# Patient Record
Sex: Male | Born: 1950 | Race: White | Hispanic: No | Marital: Single | Smoking: Current every day smoker
Health system: Southern US, Community
[De-identification: ages and names within clinical notes are randomized; demographics above are authoritative.]

## PROBLEM LIST (undated history)

## (undated) DIAGNOSIS — I10 Essential (primary) hypertension: Secondary | ICD-10-CM

## (undated) DIAGNOSIS — K219 Gastro-esophageal reflux disease without esophagitis: Secondary | ICD-10-CM

## (undated) DIAGNOSIS — E785 Hyperlipidemia, unspecified: Secondary | ICD-10-CM

## (undated) DIAGNOSIS — K279 Peptic ulcer, site unspecified, unspecified as acute or chronic, without hemorrhage or perforation: Secondary | ICD-10-CM

## (undated) DIAGNOSIS — E119 Type 2 diabetes mellitus without complications: Secondary | ICD-10-CM

## (undated) HISTORY — PX: CARDIAC CATHETERIZATION: SHX172

## (undated) HISTORY — PX: TONSILLECTOMY: SUR1361

---

## 2002-11-20 ENCOUNTER — Emergency Department (HOSPITAL_COMMUNITY): Admission: EM | Admit: 2002-11-20 | Discharge: 2002-11-20 | Payer: Self-pay | Admitting: Emergency Medicine

## 2004-12-25 ENCOUNTER — Emergency Department (HOSPITAL_COMMUNITY): Admission: EM | Admit: 2004-12-25 | Discharge: 2004-12-26 | Payer: Self-pay | Admitting: Emergency Medicine

## 2006-04-18 ENCOUNTER — Inpatient Hospital Stay (HOSPITAL_BASED_OUTPATIENT_CLINIC_OR_DEPARTMENT_OTHER): Admission: RE | Admit: 2006-04-18 | Discharge: 2006-04-18 | Payer: Self-pay | Admitting: *Deleted

## 2017-08-21 ENCOUNTER — Encounter (HOSPITAL_COMMUNITY): Payer: Self-pay | Admitting: Radiology

## 2017-08-21 ENCOUNTER — Emergency Department (HOSPITAL_COMMUNITY): Admission: EM | Admit: 2017-08-21 | Payer: Self-pay | Source: Home / Self Care

## 2017-08-21 ENCOUNTER — Other Ambulatory Visit: Payer: Self-pay

## 2017-08-21 ENCOUNTER — Emergency Department (HOSPITAL_COMMUNITY): Payer: Self-pay

## 2017-08-21 ENCOUNTER — Inpatient Hospital Stay (HOSPITAL_COMMUNITY)
Admission: EM | Admit: 2017-08-21 | Discharge: 2017-08-22 | DRG: 392 | Disposition: A | Discharge status: Home / Self Care | Payer: Self-pay | Attending: Family Medicine | Admitting: Family Medicine

## 2017-08-21 DIAGNOSIS — I10 Essential (primary) hypertension: Secondary | ICD-10-CM | POA: Diagnosis present

## 2017-08-21 DIAGNOSIS — K920 Hematemesis: Secondary | ICD-10-CM

## 2017-08-21 DIAGNOSIS — K219 Gastro-esophageal reflux disease without esophagitis: Principal | ICD-10-CM | POA: Diagnosis present

## 2017-08-21 DIAGNOSIS — K922 Gastrointestinal hemorrhage, unspecified: Secondary | ICD-10-CM | POA: Diagnosis present

## 2017-08-21 DIAGNOSIS — R197 Diarrhea, unspecified: Secondary | ICD-10-CM | POA: Diagnosis present

## 2017-08-21 DIAGNOSIS — Z8249 Family history of ischemic heart disease and other diseases of the circulatory system: Secondary | ICD-10-CM

## 2017-08-21 DIAGNOSIS — K2210 Ulcer of esophagus without bleeding: Secondary | ICD-10-CM | POA: Diagnosis present

## 2017-08-21 DIAGNOSIS — Z7984 Long term (current) use of oral hypoglycemic drugs: Secondary | ICD-10-CM

## 2017-08-21 DIAGNOSIS — F17210 Nicotine dependence, cigarettes, uncomplicated: Secondary | ICD-10-CM | POA: Diagnosis present

## 2017-08-21 DIAGNOSIS — E119 Type 2 diabetes mellitus without complications: Secondary | ICD-10-CM | POA: Diagnosis present

## 2017-08-21 DIAGNOSIS — E785 Hyperlipidemia, unspecified: Secondary | ICD-10-CM | POA: Diagnosis present

## 2017-08-21 DIAGNOSIS — R112 Nausea with vomiting, unspecified: Secondary | ICD-10-CM | POA: Diagnosis present

## 2017-08-21 HISTORY — DX: Type 2 diabetes mellitus without complications: E11.9

## 2017-08-21 HISTORY — DX: Gastro-esophageal reflux disease without esophagitis: K21.9

## 2017-08-21 HISTORY — DX: Essential (primary) hypertension: I10

## 2017-08-21 HISTORY — DX: Hyperlipidemia, unspecified: E78.5

## 2017-08-21 HISTORY — DX: Peptic ulcer, site unspecified, unspecified as acute or chronic, without hemorrhage or perforation: K27.9

## 2017-08-21 LAB — CBC WITH DIFFERENTIAL/PLATELET
Basophils Absolute: 0 10*3/uL (ref 0.0–0.1)
Basophils Relative: 0 %
Eosinophils Absolute: 0 10*3/uL (ref 0.0–0.7)
Eosinophils Relative: 0 %
HEMATOCRIT: 41.9 % (ref 39.0–52.0)
Hemoglobin: 14.2 g/dL (ref 13.0–17.0)
Lymphocytes Relative: 4 %
Lymphs Abs: 0.5 10*3/uL — ABNORMAL LOW (ref 0.7–4.0)
MCH: 30.5 pg (ref 26.0–34.0)
MCHC: 33.9 g/dL (ref 30.0–36.0)
MCV: 89.9 fL (ref 78.0–100.0)
Monocytes Absolute: 0.8 10*3/uL (ref 0.1–1.0)
Monocytes Relative: 6 %
Neutro Abs: 12.2 10*3/uL — ABNORMAL HIGH (ref 1.7–7.7)
Neutrophils Relative %: 90 %
PLATELETS: 251 10*3/uL (ref 150–400)
RBC: 4.66 MIL/uL (ref 4.22–5.81)
RDW: 12.4 % (ref 11.5–15.5)
WBC: 13.5 10*3/uL — AB (ref 4.0–10.5)

## 2017-08-21 LAB — I-STAT CHEM 8, ED
BUN: 14 mg/dL (ref 6–20)
CHLORIDE: 101 mmol/L (ref 101–111)
Calcium, Ion: 1.52 mmol/L (ref 1.15–1.40)
Creatinine, Ser: 1.3 mg/dL — ABNORMAL HIGH (ref 0.61–1.24)
Glucose, Bld: 274 mg/dL — ABNORMAL HIGH (ref 65–99)
HEMATOCRIT: 44 % (ref 39.0–52.0)
Hemoglobin: 15 g/dL (ref 13.0–17.0)
POTASSIUM: 4.1 mmol/L (ref 3.5–5.1)
SODIUM: 141 mmol/L (ref 135–145)
TCO2: 27 mmol/L (ref 22–32)

## 2017-08-21 LAB — COMPREHENSIVE METABOLIC PANEL WITH GFR
ALT: 19 U/L (ref 17–63)
AST: 22 U/L (ref 15–41)
Albumin: 4.5 g/dL (ref 3.5–5.0)
Alkaline Phosphatase: 50 U/L (ref 38–126)
Anion gap: 10 (ref 5–15)
BUN: 15 mg/dL (ref 6–20)
CO2: 30 mmol/L (ref 22–32)
Calcium: 12.1 mg/dL — ABNORMAL HIGH (ref 8.9–10.3)
Chloride: 103 mmol/L (ref 101–111)
Creatinine, Ser: 1.49 mg/dL — ABNORMAL HIGH (ref 0.61–1.24)
GFR calc Af Amer: 55 mL/min — ABNORMAL LOW
GFR calc non Af Amer: 48 mL/min — ABNORMAL LOW
Glucose, Bld: 278 mg/dL — ABNORMAL HIGH (ref 65–99)
Potassium: 4.6 mmol/L (ref 3.5–5.1)
Sodium: 143 mmol/L (ref 135–145)
Total Bilirubin: 1 mg/dL (ref 0.3–1.2)
Total Protein: 7.4 g/dL (ref 6.5–8.1)

## 2017-08-21 LAB — COMPREHENSIVE METABOLIC PANEL

## 2017-08-21 LAB — I-STAT CG4 LACTIC ACID, ED
Lactic Acid, Venous: 2.67 mmol/L (ref 0.5–1.9)
Lactic Acid, Venous: 1.7 mmol/L (ref 0.5–1.9)

## 2017-08-21 LAB — TYPE AND SCREEN
ABO/RH(D): A NEG
Antibody Screen: NEGATIVE

## 2017-08-21 LAB — I-STAT TROPONIN, ED
TROPONIN I, POC: 0 ng/mL (ref 0.00–0.08)
Troponin i, poc: 0.03 ng/mL (ref 0.00–0.08)

## 2017-08-21 LAB — URINALYSIS, ROUTINE W REFLEX MICROSCOPIC
Bacteria, UA: NONE SEEN
Bilirubin Urine: NEGATIVE
Glucose, UA: >=500 mg/dL — AB
Ketones, ur: 20 mg/dL — AB
Leukocytes, UA: NEGATIVE
Nitrite: NEGATIVE
Protein, ur: NEGATIVE mg/dL
Specific Gravity, Urine: 1.026 (ref 1.005–1.030)
pH: 7 (ref 5.0–8.0)

## 2017-08-21 LAB — CBG MONITORING, ED: Glucose-Capillary: 285 mg/dL — ABNORMAL HIGH (ref 65–99)

## 2017-08-21 LAB — LIPASE, BLOOD: Lipase: 36 U/L (ref 11–51)

## 2017-08-21 LAB — CK: Total CK: 72 U/L (ref 49–397)

## 2017-08-21 MED ORDER — SODIUM CHLORIDE 0.9 % IV SOLN
8.0000 mg/h | INTRAVENOUS | Status: DC
  Administered 2017-08-22 (×2): 8 mg/h via INTRAVENOUS
  Filled 2017-08-21 (×4): qty 80

## 2017-08-21 MED ORDER — ONDANSETRON HCL 4 MG/2ML IJ SOLN
4.0000 mg | Freq: Once | INTRAMUSCULAR | Status: AC
  Administered 2017-08-21: 4 mg via INTRAVENOUS

## 2017-08-21 MED ORDER — IOPAMIDOL (ISOVUE-370) INJECTION 76%
100.0000 mL | Freq: Once | INTRAVENOUS | Status: AC | PRN
  Administered 2017-08-21: 100 mL via INTRAVENOUS

## 2017-08-21 MED ORDER — SODIUM CHLORIDE 0.9 % IV BOLUS
1000.0000 mL | Freq: Once | INTRAVENOUS | Status: AC
  Administered 2017-08-21: 1000 mL via INTRAVENOUS

## 2017-08-21 MED ORDER — SODIUM CHLORIDE 0.9 % IV SOLN
INTRAVENOUS | Status: AC
  Administered 2017-08-22: via INTRAVENOUS

## 2017-08-21 MED ORDER — ACETAMINOPHEN 650 MG RE SUPP: 650.0000 mg | Freq: Four times a day (QID) | RECTAL | Status: DC | PRN

## 2017-08-21 MED ORDER — IOPAMIDOL (ISOVUE-370) INJECTION 76%
INTRAVENOUS | Status: AC
  Filled 2017-08-21: qty 100

## 2017-08-21 MED ORDER — MORPHINE SULFATE (PF) 4 MG/ML IV SOLN: 4.0000 mg | Freq: Once | INTRAVENOUS | Status: DC

## 2017-08-21 MED ORDER — SODIUM CHLORIDE 0.9 % IV SOLN
80.0000 mg | Freq: Once | INTRAVENOUS | Status: AC
  Administered 2017-08-22: 80 mg via INTRAVENOUS
  Filled 2017-08-21: qty 80

## 2017-08-21 MED ORDER — NITROGLYCERIN IN D5W 200-5 MCG/ML-% IV SOLN: 0.0000 ug/min | Freq: Once | INTRAVENOUS | Status: DC

## 2017-08-21 MED ORDER — PROMETHAZINE HCL 25 MG/ML IJ SOLN
12.5000 mg | Freq: Once | INTRAMUSCULAR | Status: AC
  Administered 2017-08-21: 12.5 mg via INTRAVENOUS
  Filled 2017-08-21: qty 1

## 2017-08-21 MED ORDER — SODIUM CHLORIDE 0.9 % IV BOLUS: 1000.0000 mL | Freq: Once | INTRAVENOUS | Status: DC

## 2017-08-21 MED ORDER — INSULIN ASPART 100 UNIT/ML ~~LOC~~ SOLN
0.0000 [IU] | SUBCUTANEOUS | Status: DC
  Administered 2017-08-21: 5 [IU] via SUBCUTANEOUS

## 2017-08-21 MED ORDER — MORPHINE SULFATE (PF) 4 MG/ML IV SOLN
4.0000 mg | Freq: Once | INTRAVENOUS | Status: AC
  Administered 2017-08-21: 4 mg via INTRAVENOUS

## 2017-08-21 MED ORDER — ACETAMINOPHEN 325 MG PO TABS: 650.0000 mg | ORAL_TABLET | Freq: Four times a day (QID) | ORAL | Status: DC | PRN

## 2017-08-21 MED ORDER — ONDANSETRON HCL 4 MG/2ML IJ SOLN
4.0000 mg | Freq: Once | INTRAMUSCULAR | Status: DC
  Filled 2017-08-21: qty 2

## 2017-08-21 MED ORDER — PANTOPRAZOLE SODIUM 40 MG PO TBEC
80.0000 mg | DELAYED_RELEASE_TABLET | Freq: Once | ORAL | Status: AC
  Administered 2017-08-21: 80 mg via ORAL
  Filled 2017-08-21: qty 2

## 2017-08-21 MED ORDER — FAMOTIDINE IN NACL 20-0.9 MG/50ML-% IV SOLN
20.0000 mg | Freq: Once | INTRAVENOUS | Status: AC
  Administered 2017-08-21: 20 mg via INTRAVENOUS
  Filled 2017-08-21: qty 50

## 2017-08-21 MED ORDER — ONDANSETRON HCL 4 MG/2ML IJ SOLN: 4.0000 mg | Freq: Four times a day (QID) | INTRAMUSCULAR | Status: DC | PRN

## 2017-08-21 NOTE — H&P (Signed)
TRH H&P   Patient Demographics:    Daniel Savage, is a 67 y.o. male  MRN: [id number]   DOB - 1950-10-16  Admit Date - 08/21/2017  Outpatient Primary MD for the patient is Patient, No Pcp Per  Referring MD/NP/PA: Chaney Malling Outpatient Specialists:   Patient coming from: home  Chief Complaint  Patient presents with  . Chest Pain  . Abdominal Pain      HPI:    Daniel Savage  is a 67 y.o. male, w Genella Rife, PUD, hypertension, hyperlipidemia, dm2, apparently c/o n/v, intractable multiple times. w a few episodes of vomitting of blood about 1/4 cup,  ? Coffee grounds. And diarrhea.   Subsequently followed by chest pain , substernal , sharp, without radiation.  Pt denies fever, chills, cough, palp, sob, abd pain, brbpr, black stool.   Pt denies any recent travel or odd food eaten.   In ED,  CTA chest IMPRESSION: Vascular:  There is no evidence of aortic dissection. There is no acute vascular pathology.  Nonvascular:  There is wall thickening in the mid and distal esophagus. Findings may represent inflammation from reflux. Inflammatory process is not excluded.  Nonspecific bilateral perinephric stranding. Correlation with urinalysis is recommended.  Urinalysis pending.    Wbc 13.5, Hgb 14.2, Plt 251 Na 143, K 4.6, Bun 15, Creatinine 1.49 Ast 22, Alt 19 Lipase 36 Trop 0.03  Pt will be admitted for intractable n/v, bloody emesis, and also diarrhea, and also chest pain.    Review of systems:    In addition to the HPI above,  No Fever-chills, No Headache, No changes with Vision or hearing, No problems swallowing food or Liquids, No  Cough or Shortness of Breath, No Abdominal pain, No Blood in stool or Urine, No dysuria, No new skin rashes or bruises, No new joints pains-aches,  No new weakness, tingling, numbness in any extremity, No recent weight gain or loss, No  polyuria, polydypsia or polyphagia, No significant Mental Stressors.  A full 10 point Review of Systems was done, except as stated above, all other Review of Systems were negative.   With Past History of the following :    Past Medical History:  Diagnosis Date  . Diabetes mellitus without complication (HCC)   . GERD (gastroesophageal reflux disease)   . Hyperlipidemia   . Hypertension   . PUD (peptic ulcer disease)       Past Surgical History:  Procedure Laterality Date  . CARDIAC CATHETERIZATION    . TONSILLECTOMY        Social History:     Social History   Tobacco Use  . Smoking status: Current Every Day Smoker    Types: Cigarettes  . Smokeless tobacco: Never Used  Substance Use Topics  . Alcohol use: Not Currently     Lives - at home  Mobility - walks by self   Family History :  Family History  Problem Relation Age of Onset  . CAD Father        Home Medications:   Prior to Admission medications   Medication Sig Start Date End Date Taking? Authorizing Provider  GARLIC PO Take 1 tablet by mouth daily.   Yes [provider]  lisinopril (PRINIVIL,ZESTRIL) 10 MG tablet Take 10 mg by mouth daily.   Yes [provider]  metFORMIN (GLUCOPHAGE-XR) 500 MG 24 hr tablet Take 500 mg by mouth daily as needed (Blood sugar).   Yes [provider]  omeprazole (PRILOSEC OTC) 20 MG tablet Take 20 mg by mouth daily.   Yes [provider]     Allergies:    No Known Allergies   Physical Exam:   Vitals  Blood pressure (!) 139/127, pulse 70, resp. rate 18, height 5\' 6"  (1.676 m), weight 72.6 kg (160 lb), SpO2 96 %.   1. General  lying in bed in NAD,  2. Normal affect and insight, Not Suicidal or Homicidal, Awake Alert, Oriented X 3.  3. No F.N deficits, ALL C.Nerves Intact, Strength 5/5 all 4 extremities, Sensation intact all 4 extremities, Plantars down going.  4. Ears and Eyes appear Normal, Conjunctivae clear, PERRLA.  Moist Oral Mucosa.  5. Supple Neck, No JVD, No cervical lymphadenopathy appriciated, No Carotid Bruits.  6. Symmetrical Chest wall movement, Good air movement bilaterally, CTAB.  7. RRR, No Gallops, Rubs or Murmurs, No Parasternal Heave.  8. Positive Bowel Sounds, Abdomen Soft, No tenderness, No organomegaly appriciated,No rebound -guarding or rigidity.  9.  No Cyanosis, Normal Skin Turgor, No Skin Rash or Bruise.  10. Good muscle tone,  joints appear normal , no effusions, Normal ROM.  11. No Palpable Lymph Nodes in Neck or Axillae     Data Review:    CBC Recent Labs  Lab 08/21/17 1847 08/21/17 1956 08/21/17 2015  WBC PATIENT IDENTIFICATION ERROR. PLEASE DISREGARD RESULTS. ACCOUNT WILL BE CREDITED. 13.5*  --   HGB PATIENT IDENTIFICATION ERROR. PLEASE DISREGARD RESULTS. ACCOUNT WILL BE CREDITED. 14.2 15.0  HCT PATIENT IDENTIFICATION ERROR. PLEASE DISREGARD RESULTS. ACCOUNT WILL BE CREDITED. 41.9 44.0  PLT PATIENT IDENTIFICATION ERROR. PLEASE DISREGARD RESULTS. ACCOUNT WILL BE CREDITED. 251  --   MCV PATIENT IDENTIFICATION ERROR. PLEASE DISREGARD RESULTS. ACCOUNT WILL BE CREDITED. 89.9  --   MCH PATIENT IDENTIFICATION ERROR. PLEASE DISREGARD RESULTS. ACCOUNT WILL BE CREDITED. 30.5  --   MCHC PATIENT IDENTIFICATION ERROR. PLEASE DISREGARD RESULTS. ACCOUNT WILL BE CREDITED. 33.9  --   RDW PATIENT IDENTIFICATION ERROR. PLEASE DISREGARD RESULTS. ACCOUNT WILL BE CREDITED. 12.4  --   LYMPHSABS PATIENT IDENTIFICATION ERROR. PLEASE DISREGARD RESULTS. ACCOUNT WILL BE CREDITED. 0.5*  --   MONOABS PATIENT IDENTIFICATION ERROR. PLEASE DISREGARD RESULTS. ACCOUNT WILL BE CREDITED. 0.8  --   EOSABS PATIENT IDENTIFICATION ERROR. PLEASE DISREGARD RESULTS. ACCOUNT WILL BE CREDITED. 0.0  --   BASOSABS PATIENT IDENTIFICATION ERROR. PLEASE DISREGARD RESULTS. ACCOUNT WILL BE CREDITED. 0.0  --     ------------------------------------------------------------------------------------------------------------------  Chemistries  Recent Labs  Lab 08/21/17 1847 08/21/17 1956 08/21/17 2015  NA PATIENT IDENTIFICATION ERROR. PLEASE DISREGARD RESULTS. ACCOUNT WILL BE CREDITED. 143 141  K PATIENT IDENTIFICATION ERROR. PLEASE DISREGARD RESULTS. ACCOUNT WILL BE CREDITED. 4.6 4.1  CL PATIENT IDENTIFICATION ERROR. PLEASE DISREGARD RESULTS. ACCOUNT WILL BE CREDITED. 103 101  CO2 PATIENT IDENTIFICATION ERROR. PLEASE DISREGARD RESULTS. ACCOUNT WILL BE CREDITED. 30  --   GLUCOSE PATIENT IDENTIFICATION ERROR. PLEASE DISREGARD RESULTS. ACCOUNT WILL BE CREDITED. 278* 274*  BUN PATIENT IDENTIFICATION ERROR. PLEASE DISREGARD RESULTS. ACCOUNT WILL BE CREDITED. 15 14  CREATININE PATIENT IDENTIFICATION ERROR. PLEASE DISREGARD RESULTS. ACCOUNT WILL BE CREDITED. 1.49* 1.30*  CALCIUM PATIENT IDENTIFICATION ERROR. PLEASE DISREGARD RESULTS. ACCOUNT WILL BE CREDITED. 12.1*  --   AST PATIENT IDENTIFICATION ERROR. PLEASE DISREGARD RESULTS. ACCOUNT WILL BE CREDITED. 22  --   ALT PATIENT IDENTIFICATION ERROR. PLEASE DISREGARD RESULTS. ACCOUNT WILL BE CREDITED. 19  --   ALKPHOS PATIENT IDENTIFICATION ERROR. PLEASE DISREGARD RESULTS. ACCOUNT WILL BE CREDITED. 50  --   BILITOT PATIENT IDENTIFICATION ERROR. PLEASE DISREGARD RESULTS. ACCOUNT WILL BE CREDITED. 1.0  --    ------------------------------------------------------------------------------------------------------------------ estimated creatinine clearance is 51.8 mL/min (A) (by C-G formula based on SCr of 1.3 mg/dL (H)). ------------------------------------------------------------------------------------------------------------------ No results for input(s): TSH, T4TOTAL, T3FREE, THYROIDAB in the last 72 hours.  Invalid input(s): FREET3  Coagulation profile No results for input(s): INR, PROTIME in the last 168  hours. ------------------------------------------------------------------------------------------------------------------- No results for input(s): DDIMER in the last 72 hours. -------------------------------------------------------------------------------------------------------------------  Cardiac Enzymes No results for input(s): CKMB, TROPONINI, MYOGLOBIN in the last 168 hours.  Invalid input(s): CK ------------------------------------------------------------------------------------------------------------------ No results found for: BNP   ---------------------------------------------------------------------------------------------------------------  Urinalysis No results found for: COLORURINE, APPEARANCEUR, LABSPEC, PHURINE, GLUCOSEU, HGBUR, BILIRUBINUR, KETONESUR, PROTEINUR, UROBILINOGEN, NITRITE, LEUKOCYTESUR  ----------------------------------------------------------------------------------------------------------------   Imaging Results:    Dg Chest Port 1 View  Result Date: 08/21/2017 CLINICAL DATA:  Chest pain.  Short of breath. EXAM: PORTABLE CHEST 1 VIEW COMPARISON:  12/25/2004 FINDINGS: Normal heart size. Subsegmental atelectasis at the left base. Otherwise clear lungs. No pneumothorax or pleural effusion. Normal vascularity. IMPRESSION: No active disease. Electronically Signed   By: Jolaine Click M.D.   On: 08/21/2017 19:50   Ct Angio Chest/abd/pel For Dissection W And/or Wo Contrast  Result Date: 08/21/2017 CLINICAL DATA:  Chest pain radiating to the back EXAM: CT ANGIOGRAPHY CHEST, ABDOMEN AND PELVIS TECHNIQUE: Multidetector CT imaging through the chest, abdomen and pelvis was performed using the standard protocol during bolus administration of intravenous contrast. Multiplanar reconstructed images and MIPs were obtained and reviewed to evaluate the vascular anatomy. CONTRAST:  ISOVUE-370 IOPAMIDOL (ISOVUE-370) INJECTION 76% COMPARISON:  12/26/2004 FINDINGS: CTA  CHEST FINDINGS Cardiovascular: Allowing for motion artifact, there is no evidence of aortic intramural hematoma, aortic dissection, or aortic aneurysm. Atherosclerotic calcification of the aortic arch is noted. There is no evidence of acute pulmonary thromboembolism. Mediastinum/Nodes: No abnormal mediastinal adenopathy. Thyroid is unremarkable. There is wall thickening of the mid and distal esophagus. Lungs/Pleura: No pneumothorax. No pleural effusion. Minimal scattered atelectasis towards the lung bases. 2 mm left lower lobe pulmonary nodule on image 71 of series 6. 5 mm left upper lobe pulmonary nodule on image 18. Both of these nodules are stable compared to the prior study. Musculoskeletal: No vertebral compression deformity. Review of the MIP images confirms the above findings. CTA ABDOMEN AND PELVIS FINDINGS VASCULAR Aorta: Aorta is nonaneurysmal and patent. There is no evidence of dissection. Mild atherosclerotic calcification in the infrarenal aorta. Celiac: Patent.  Branch vessels patent. SMA: Patent.  Branch vessels are grossly patent. Renals: Single renal arteries are patent. IMA: Patent.  Branch vessels are grossly patent. Inflow: Bilateral common iliac, external iliac, and internal iliac arteries are patent. Mild atherosclerotic calcifications at the origin of the right common iliac artery. Review of the MIP images confirms the above findings. NON-VASCULAR Hepatobiliary: Liver and gallbladder are within normal limits. Pancreas: Unremarkable Spleen: Unremarkable Adrenals/Urinary Tract: There is nonspecific perinephric stranding bilaterally. Renal parenchyma is within normal limits.  Adrenal glands are within normal limits. Bladder is unremarkable. Stomach/Bowel: Appendix is within normal limits. No obvious mass in the colon. No evidence of small-bowel obstruction. Stomach is within normal limits. Lymphatic: No evidence of abnormal retroperitoneal adenopathy. Reproductive: Prostate is upper normal in  size. Other: No free fluid. Musculoskeletal: There is no vertebral compression deformity. Review of the MIP images confirms the above findings. IMPRESSION: Vascular: There is no evidence of aortic dissection. There is no acute vascular pathology. Nonvascular: There is wall thickening in the mid and distal esophagus. Findings may represent inflammation from reflux. Inflammatory process is not excluded. Nonspecific bilateral perinephric stranding. Correlation with urinalysis is recommended. Electronically Signed   By: Jolaine Click M.D.   On: 08/21/2017 21:12    nsr at 85, nl axis, poor R progression st depression in v5,6   Assessment & Plan:    Principal Problem:   Nausea & vomiting Active Problems:   GI bleeding   Diarrhea   Essential hypertension   Diabetes mellitus without complication (HCC)   GERD (gastroesophageal reflux disease)    Nausea and vomitting NPO zofran 4mg  iv q6h prn   Hematemesis NPO Protonix 80mg  iv x1 then 8mg / hr iv Gi consulted by ED, appreciate input  Chest pain Tele Trop I q6h x3 Check cardiac echo  Diarrhea Check stool for c. Diff, gi pathogen panel Consider Flagyl 500mg  po tid if persistent  Gerd  protonix as above  Bilateral perinephric stranding Urinalysis pending  Dm2 HOLD METFORMIN DUE TO DIARRHEA fsbs q4h and ISS  Hypertension Cont Lisinopril 10mg  poq day for now   DVT Prophylaxis  - SCDs  AM Labs Ordered, also please review Full Orders  Family Communication: Admission, patients condition and plan of care including tests being ordered have been discussed with the patient who indicate understanding and agree with the plan and Code Status.  Code Status  FULL CODE  Likely DC to  home  Condition GUARDED    Consults called:  none  Admission status:  inpatient  Time spent in minutes : 60   Pearson Grippe M.D on 08/21/2017 at 10:34 PM  Between 7am to 7pm - Page  [phone]   After 7pm go to [email or web address] - password [facility]  [facility] - Office  [phone]

## 2017-08-21 NOTE — ED Notes (Signed)
ED TO INPATIENT HANDOFF REPORT  Name/Age/Gender Daniel Savage 67 y.o. male  Code Status   Home/SNF/Other Home  Chief Complaint chest pain   Level of Care/Admitting Diagnosis ED Disposition    ED Disposition Condition [facility]: [place] [[id number]]  Level of Care: Stepdown [14]  Admit to SDU based on following criteria: Hemodynamic compromise or significant risk of instability:  Patient requiring short term acute titration and management of vasoactive drips, and invasive monitoring (i.e., CVP and Arterial line).  Diagnosis: GI bleeding [517616]  Admitting Physician: Jani Gravel [[id number]]  Attending Physician: Jani Gravel [phone]  Estimated length of stay: past midnight tomorrow  Certification:: I certify this patient will need inpatient services for at least 2 midnights  PT Class (Do Not Modify): Inpatient [101]  PT Acc Code (Do Not Modify): Private [1]       Medical History History reviewed. No pertinent past medical history.  Allergies No Known Allergies  IV Location/Drains/Wounds Patient Lines/Drains/Airways Status   Active Line/Drains/Airways    Name:   Placement date:   Placement time:   Site:   Days:   Peripheral IV   -    -    -             Labs/Imaging Results for orders placed or performed during the hospital encounter of 08/21/17 (from the past 48 hour(s))  Type and screen     Status: None (Preliminary result)   Collection Time: 08/21/17  7:56 PM  Result Value Ref Range   ABO/RH(D) PENDING    Antibody Screen NEG    Sample Expiration      08/24/2017 Performed at [facility], [place] [address], [place], [place] [address]   CBC with Differential/Platelet     Status: Abnormal   Collection Time: 08/21/17  7:56 PM  Result Value Ref Range   WBC 13.5 (H) 4.0 - 10.5 K/uL   RBC 4.66 4.22 - 5.81 MIL/uL   Hemoglobin 14.2 13.0 - 17.0 g/dL   HCT 41.9 39.0 - 52.0 %   MCV 89.9 78.0 - 100.0 fL   MCH 30.5  26.0 - 34.0 pg   MCHC 33.9 30.0 - 36.0 g/dL   RDW 12.4 11.5 - 15.5 %   Platelets 251 150 - 400 K/uL   Neutrophils Relative % 90 %   Neutro Abs 12.2 (H) 1.7 - 7.7 K/uL   Lymphocytes Relative 4 %   Lymphs Abs 0.5 (L) 0.7 - 4.0 K/uL   Monocytes Relative 6 %   Monocytes Absolute 0.8 0.1 - 1.0 K/uL   Eosinophils Relative 0 %   Eosinophils Absolute 0.0 0.0 - 0.7 K/uL   Basophils Relative 0 %   Basophils Absolute 0.0 0.0 - 0.1 K/uL    Comment: Performed at [facility], [place] [address], [place],  [address]  Comprehensive metabolic panel     Status: Abnormal   Collection Time: 08/21/17  7:56 PM  Result Value Ref Range   Sodium 143 135 - 145 mmol/L   Potassium 4.6 3.5 - 5.1 mmol/L   Chloride 103 101 - 111 mmol/L   CO2 30 22 - 32 mmol/L   Glucose, Bld 278 (H) 65 - 99 mg/dL   BUN 15 6 - 20 mg/dL   Creatinine, Ser 1.49 (H) 0.61 - 1.24 mg/dL   Calcium 12.1 (H) 8.9 - 10.3 mg/dL   Total Protein 7.4 6.5 - 8.1 g/dL   Albumin 4.5 3.5 -  5.0 g/dL   AST 22 15 - 41 U/L   ALT 19 17 - 63 U/L   Alkaline Phosphatase 50 38 - 126 U/L   Total Bilirubin 1.0 0.3 - 1.2 mg/dL   GFR calc non Af Amer 48 (L) >60 mL/min   GFR calc Af Amer 55 (L) >60 mL/min    Comment: (NOTE) The eGFR has been calculated using the CKD EPI equation. This calculation has not been validated in all clinical situations. eGFR's persistently <60 mL/min signify possible Chronic Kidney Disease.    Anion gap 10 5 - 15    Comment: Performed at [facility], [place] [address] [place], [place] [address]  Lipase, blood     Status: None   Collection Time: 08/21/17  7:56 PM  Result Value Ref Range   Lipase 36 11 - 51 U/L    Comment: Performed at [facility], [place] [address], [place], [place] [address]  CK     Status: None   Collection Time: 08/21/17  7:56 PM  Result Value Ref Range   Total CK 72 49 - 397 U/L    Comment: Performed at [facility], [place]  [address], [place], [place] [address]  I-stat troponin, ED     Status: None   Collection Time: 08/21/17  8:13 PM  Result Value Ref Range   Troponin i, poc 0.03 0.00 - 0.08 ng/mL   Comment 3            Comment: Due to the release kinetics of cTnI, a negative result within the first hours of the onset of symptoms does not rule out myocardial infarction with certainty. If myocardial infarction is still suspected, repeat the test at appropriate intervals.   I-stat chem 8, ed     Status: Abnormal   Collection Time: 08/21/17  8:15 PM  Result Value Ref Range   Sodium 141 135 - 145 mmol/L   Potassium 4.1 3.5 - 5.1 mmol/L   Chloride 101 101 - 111 mmol/L   BUN 14 6 - 20 mg/dL   Creatinine, Ser 1.30 (H) 0.61 - 1.24 mg/dL   Glucose, Bld 274 (H) 65 - 99 mg/dL   Calcium, Ion 1.52 (HH) 1.15 - 1.40 mmol/L   TCO2 27 22 - 32 mmol/L   Hemoglobin 15.0 13.0 - 17.0 g/dL   HCT 44.0 39.0 - 52.0 %   Comment NOTIFIED PHYSICIAN   I-Stat CG4 Lactic Acid, ED     Status: None   Collection Time: 08/21/17  8:16 PM  Result Value Ref Range   Lactic Acid, Venous 1.70 0.5 - 1.9 mmol/L   Dg Chest Port 1 View  Result Date: 08/21/2017 CLINICAL DATA:  Chest pain.  Short of breath. EXAM: PORTABLE CHEST 1 VIEW COMPARISON:  12/25/2004 FINDINGS: Normal heart size. Subsegmental atelectasis at the left base. Otherwise clear lungs. No pneumothorax or pleural effusion. Normal vascularity. IMPRESSION: No active disease. Electronically Signed   By: Marybelle Killings M.D.   On: 08/21/2017 19:50   Ct Angio Chest/abd/pel For Dissection W And/or Wo Contrast  Result Date: 08/21/2017 CLINICAL DATA:  Chest pain radiating to the back EXAM: CT ANGIOGRAPHY CHEST, ABDOMEN AND PELVIS TECHNIQUE: Multidetector CT imaging through the chest, abdomen and pelvis was performed using the standard protocol during bolus administration of intravenous contrast. Multiplanar reconstructed images and MIPs were obtained and reviewed to evaluate the vascular  anatomy. CONTRAST:  164m ISOVUE-370 IOPAMIDOL (ISOVUE-370) INJECTION 76% COMPARISON:  12/26/2004 FINDINGS: CTA CHEST  FINDINGS Cardiovascular: Allowing for motion artifact, there is no evidence of aortic intramural hematoma, aortic dissection, or aortic aneurysm. Atherosclerotic calcification of the aortic arch is noted. There is no evidence of acute pulmonary thromboembolism. Mediastinum/Nodes: No abnormal mediastinal adenopathy. Thyroid is unremarkable. There is wall thickening of the mid and distal esophagus. Lungs/Pleura: No pneumothorax. No pleural effusion. Minimal scattered atelectasis towards the lung bases. 2 mm left lower lobe pulmonary nodule on image 71 of series 6. 5 mm left upper lobe pulmonary nodule on image 18. Both of these nodules are stable compared to the prior study. Musculoskeletal: No vertebral compression deformity. Review of the MIP images confirms the above findings. CTA ABDOMEN AND PELVIS FINDINGS VASCULAR Aorta: Aorta is nonaneurysmal and patent. There is no evidence of dissection. Mild atherosclerotic calcification in the infrarenal aorta. Celiac: Patent.  Branch vessels patent. SMA: Patent.  Branch vessels are grossly patent. Renals: Single renal arteries are patent. IMA: Patent.  Branch vessels are grossly patent. Inflow: Bilateral common iliac, external iliac, and internal iliac arteries are patent. Mild atherosclerotic calcifications at the origin of the right common iliac artery. Review of the MIP images confirms the above findings. NON-VASCULAR Hepatobiliary: Liver and gallbladder are within normal limits. Pancreas: Unremarkable Spleen: Unremarkable Adrenals/Urinary Tract: There is nonspecific perinephric stranding bilaterally. Renal parenchyma is within normal limits. Adrenal glands are within normal limits. Bladder is unremarkable. Stomach/Bowel: Appendix is within normal limits. No obvious mass in the colon. No evidence of small-bowel obstruction. Stomach is within normal  limits. Lymphatic: No evidence of abnormal retroperitoneal adenopathy. Reproductive: Prostate is upper normal in size. Other: No free fluid. Musculoskeletal: There is no vertebral compression deformity. Review of the MIP images confirms the above findings. IMPRESSION: Vascular: There is no evidence of aortic dissection. There is no acute vascular pathology. Nonvascular: There is wall thickening in the mid and distal esophagus. Findings may represent inflammation from reflux. Inflammatory process is not excluded. Nonspecific bilateral perinephric stranding. Correlation with urinalysis is recommended. Electronically Signed   By: Marybelle Killings M.D.   On: 08/21/2017 21:12    Pending Labs Unresulted Labs (From admission, onward)   Start     Ordered   08/21/17 2203  Troponin I (q 6hr x 3)  Now then every 6 hours,   R     08/21/17 2202   08/21/17 2117  Urinalysis, Routine w reflex microscopic  Once,   R     08/21/17 2116      Vitals/Pain Today's Vitals   08/21/17 2140 08/21/17 2154 08/21/17 2156 08/21/17 2200  BP:    (!) 154/91  Pulse:    81  Resp:    15  SpO2:    96%  Weight:   160 lb (72.6 kg)   Height:   5' 6"  (1.676 m)   PainSc: 7  7       Isolation Precautions No active isolations  Medications Medications  iopamidol (ISOVUE-370) 76 % injection (has no administration in time range)  sodium chloride 0.9 % bolus 1,000 mL (0 mLs Intravenous Stopped 08/21/17 2039)  ondansetron (ZOFRAN) injection 4 mg (4 mg Intravenous Given 08/21/17 2038)  morphine 4 MG/ML injection 4 mg (4 mg Intravenous Given 08/21/17 2040)  pantoprazole (PROTONIX) EC tablet 80 mg (80 mg Oral Given 08/21/17 2034)  iopamidol (ISOVUE-370) 76 % injection 100 mL (100 mLs Intravenous Contrast Given 08/21/17 2036)  promethazine (PHENERGAN) injection 12.5 mg (12.5 mg Intravenous Given 08/21/17 2140)  famotidine (PEPCID) IVPB 20 mg premix (20 mg Intravenous New  Bag/Given 08/21/17 2134)  sodium chloride 0.9 % bolus 1,000 mL (1,000  mLs Intravenous New Bag/Given 08/21/17 2134)    Mobility walks

## 2017-08-21 NOTE — ED Notes (Signed)
Patient transported to CT 

## 2017-08-21 NOTE — ED Notes (Signed)
I gave critical I stat Chem 8 result to MD Silverio LayYao

## 2017-08-21 NOTE — ED Notes (Signed)
Bed: RESB Expected date:  Expected time:  Means of arrival:  Comments: EMS chest pain

## 2017-08-21 NOTE — ED Provider Notes (Addendum)
[facility]-EMERGENCY DEPT Provider Note   CSN: [id number] Arrival date & time: 08/21/17  4098     History   Chief Complaint No chief complaint on file.   HPI Daniel Savage is a 67 y.o. male history of MI, previous GI bleed, here with abdominal pain, vomiting, chest pain.  Patient states that since 3 pm, he has numerous episodes of vomiting. He states that the vomiting is dark and appears like coffee grounds.  He then had some diaphoresis as well as chest pain.  He states that he has been working in the heat for the whole day and has not been drinking much water.  Patient does have previous heart attack in 2008 but he does not remember if he had a stent or not.  He has not been followed up with cardiology for several years. He states that he has known stomach ulcers and has seen GI elsewhere. Patient didn't take his prilosec yesterday. He is not on blood thinners or ASA or motrin.   The history is provided by the patient.    History reviewed. No pertinent past medical history.  There are no active problems to display for this patient.        Home Medications    Prior to Admission medications   Medication Sig Start Date End Date Taking? Authorizing Provider  GARLIC PO Take 1 tablet by mouth daily.   Yes [provider]  lisinopril (PRINIVIL,ZESTRIL) 10 MG tablet Take 10 mg by mouth daily.   Yes [provider]  metFORMIN (GLUCOPHAGE-XR) 500 MG 24 hr tablet Take 500 mg by mouth daily as needed (Blood sugar).   Yes [provider]  omeprazole (PRILOSEC OTC) 20 MG tablet Take 20 mg by mouth daily.   Yes [provider]    Family History No family history on file.  Social History Social History   Tobacco Use  . Smoking status: Not on file  Substance Use Topics  . Alcohol use: Not on file  . Drug use: Not on file     Allergies   Patient has no known allergies.   Review of Systems Review of Systems    Cardiovascular: Positive for chest pain.  Gastrointestinal: Positive for abdominal pain.  All other systems reviewed and are negative.    Physical Exam Updated Vital Signs BP (!) 141/88   Pulse 82   Resp 12   SpO2 97%   Physical Exam  Constitutional: He is oriented to person, place, and time.  Uncomfortable, vomiting, diaphoretic   HENT:  Head: Normocephalic.  Eyes: Pupils are equal, round, and reactive to light. Conjunctivae and EOM are normal.  Neck: Normal range of motion. Neck supple.  Cardiovascular: Normal rate, regular rhythm and normal heart sounds.  Pulmonary/Chest: Effort normal and breath sounds normal. No stridor. No respiratory distress.  Abdominal: Soft.  + epigastric tenderness, mild periumbilical tenderness   Musculoskeletal: Normal range of motion.  Neurological: He is alert and oriented to person, place, and time. No cranial nerve deficit. Coordination normal.  Skin: Skin is warm. Capillary refill takes less than 2 seconds.  Psychiatric: He has a normal mood and affect.  Nursing note and vitals reviewed.    ED Treatments / Results  Labs (all labs ordered are listed, but only abnormal results are displayed) Labs Reviewed  CBC WITH DIFFERENTIAL/PLATELET - Abnormal; Notable for the following components:      Result Value   WBC 13.5 (*)    Neutro Abs  12.2 (*)    Lymphs Abs 0.5 (*)    All other components within normal limits  COMPREHENSIVE METABOLIC PANEL - Abnormal; Notable for the following components:   Glucose, Bld 278 (*)    Creatinine, Ser 1.49 (*)    Calcium 12.1 (*)    GFR calc non Af Amer 48 (*)    GFR calc Af Amer 55 (*)    All other components within normal limits  I-STAT CHEM 8, ED - Abnormal; Notable for the following components:   Creatinine, Ser 1.30 (*)    Glucose, Bld 274 (*)    Calcium, Ion 1.52 (*)    All other components within normal limits  LIPASE, BLOOD  CK  URINALYSIS, ROUTINE W REFLEX MICROSCOPIC  I-STAT TROPONIN, ED   I-STAT CG4 LACTIC ACID, ED  TYPE AND SCREEN    EKG EKG Interpretation  Date/Time:  Wednesday August 21 2017 19:50:15 EDT Ventricular Rate:  87 PR Interval:    QRS Duration: 139 QT Interval:  379 QTC Calculation: 456 R Axis:   71 Text Interpretation:  Sinus rhythm Left bundle branch block No previous ECGs available Confirmed by Richardean Canal ([id number]) on 08/21/2017 7:52:17 PM   Radiology Dg Chest Port 1 View  Result Date: 08/21/2017 CLINICAL DATA:  Chest pain.  Short of breath. EXAM: PORTABLE CHEST 1 VIEW COMPARISON:  12/25/2004 FINDINGS: Normal heart size. Subsegmental atelectasis at the left base. Otherwise clear lungs. No pneumothorax or pleural effusion. Normal vascularity. IMPRESSION: No active disease. Electronically Signed   By: Jolaine Click M.D.   On: 08/21/2017 19:50   Ct Angio Chest/abd/pel For Dissection W And/or Wo Contrast  Result Date: 08/21/2017 CLINICAL DATA:  Chest pain radiating to the back EXAM: CT ANGIOGRAPHY CHEST, ABDOMEN AND PELVIS TECHNIQUE: Multidetector CT imaging through the chest, abdomen and pelvis was performed using the standard protocol during bolus administration of intravenous contrast. Multiplanar reconstructed images and MIPs were obtained and reviewed to evaluate the vascular anatomy. CONTRAST:  ISOVUE-370 IOPAMIDOL (ISOVUE-370) INJECTION 76% COMPARISON:  12/26/2004 FINDINGS: CTA CHEST FINDINGS Cardiovascular: Allowing for motion artifact, there is no evidence of aortic intramural hematoma, aortic dissection, or aortic aneurysm. Atherosclerotic calcification of the aortic arch is noted. There is no evidence of acute pulmonary thromboembolism. Mediastinum/Nodes: No abnormal mediastinal adenopathy. Thyroid is unremarkable. There is wall thickening of the mid and distal esophagus. Lungs/Pleura: No pneumothorax. No pleural effusion. Minimal scattered atelectasis towards the lung bases. 2 mm left lower lobe pulmonary nodule on image 71 of series 6. 5 mm left  upper lobe pulmonary nodule on image 18. Both of these nodules are stable compared to the prior study. Musculoskeletal: No vertebral compression deformity. Review of the MIP images confirms the above findings. CTA ABDOMEN AND PELVIS FINDINGS VASCULAR Aorta: Aorta is nonaneurysmal and patent. There is no evidence of dissection. Mild atherosclerotic calcification in the infrarenal aorta. Celiac: Patent.  Branch vessels patent. SMA: Patent.  Branch vessels are grossly patent. Renals: Single renal arteries are patent. IMA: Patent.  Branch vessels are grossly patent. Inflow: Bilateral common iliac, external iliac, and internal iliac arteries are patent. Mild atherosclerotic calcifications at the origin of the right common iliac artery. Review of the MIP images confirms the above findings. NON-VASCULAR Hepatobiliary: Liver and gallbladder are within normal limits. Pancreas: Unremarkable Spleen: Unremarkable Adrenals/Urinary Tract: There is nonspecific perinephric stranding bilaterally. Renal parenchyma is within normal limits. Adrenal glands are within normal limits. Bladder is unremarkable. Stomach/Bowel: Appendix is within normal limits. No obvious mass in  the colon. No evidence of small-bowel obstruction. Stomach is within normal limits. Lymphatic: No evidence of abnormal retroperitoneal adenopathy. Reproductive: Prostate is upper normal in size. Other: No free fluid. Musculoskeletal: There is no vertebral compression deformity. Review of the MIP images confirms the above findings. IMPRESSION: Vascular: There is no evidence of aortic dissection. There is no acute vascular pathology. Nonvascular: There is wall thickening in the mid and distal esophagus. Findings may represent inflammation from reflux. Inflammatory process is not excluded. Nonspecific bilateral perinephric stranding. Correlation with urinalysis is recommended. Electronically Signed   By: Jolaine ClickArthur  Hoss M.D.   On: 08/21/2017 21:12     Procedures Procedures (including critical care time)  Medications Ordered in ED Medications  iopamidol (ISOVUE-370) 76 % injection (has no administration in time range)  famotidine (PEPCID) IVPB 20 mg premix (20 mg Intravenous New Bag/Given 08/21/17 2134)  sodium chloride 0.9 % bolus 1,000 mL (1,000 mLs Intravenous New Bag/Given 08/21/17 2134)  sodium chloride 0.9 % bolus 1,000 mL (0 mLs Intravenous Stopped 08/21/17 2039)  ondansetron (ZOFRAN) injection 4 mg (4 mg Intravenous Given 08/21/17 2038)  morphine 4 MG/ML injection 4 mg (4 mg Intravenous Given 08/21/17 2040)  pantoprazole (PROTONIX) EC tablet 80 mg (80 mg Oral Given 08/21/17 2034)  iopamidol (ISOVUE-370) 76 % injection 100 mL (100 mLs Intravenous Contrast Given 08/21/17 2036)  promethazine (PHENERGAN) injection 12.5 mg (12.5 mg Intravenous Given 08/21/17 2140)     Initial Impression / Assessment and Plan / ED Course  I have reviewed the triage vital signs and the nursing notes.  Pertinent labs & imaging results that were available during my care of the patient were reviewed by me and considered in my medical decision making (see chart for details).     Daniel Savage is a 67 y.o. male here with abdominal pain, chest pain, diaphoresis. Patient has LBBB on EKG. He was initially registered under another MRN and the LBBB appears new. His initial trop is normal and I called Dr. Eldridge DaceVaranasi, STEMI doctor, who doesn't think we need to activate STEMI given normal troponin. Consider upper GI bleed vs dissection vs unstable angina vs rhabdo. Will get labs, CTA dissection study, type and screen.   9:45 PM Hg 14. Trop neg. Still nauseated after zofran so will order phenergan. His dissection study showed no dissection but there is distal esophageal inflammation likely esophagitis from the coffee ground emesis. He is given pepcid and protonix as well. I consulted GI (Dr. Ewing SchleinMagod) to see patient. He recommend NPO after midnight. Hospitalist to admit.     Final Clinical Impressions(s) / ED Diagnoses   Final diagnoses:  None    ED Discharge Orders    None       Charlynne PanderYao, Jann Milkovich Hsienta, MD 08/21/17 2145    Charlynne PanderYao, Maeve Debord Hsienta, MD 08/21/17 2145

## 2017-08-21 NOTE — Progress Notes (Signed)
Pt arrived to floor from ED, settled in bed. Notified at 2309 that pt's bed status has been changed to 4W. Called report to [place] Valley[place]idi, Charity fundraiserN. Transferred pt to fourth floor via wheelchair.

## 2017-08-21 NOTE — ED Triage Notes (Signed)
Per EMS:  Pt c/o of abdominal pain with nausea and vomiting for 12 hours.  Vomit is black. Pt also has c/o of chest pain in center of chest.  400 mg of IV zofran given with no relief.  500 cc of NS also given.  Pt appeared diaphoretic.  L bundle branch block noted on EKG.  Pt does not know if that is normal for him.  Pt has a hx of MI, DM, HTN, and has a cardiac stent. 

## 2017-08-21 NOTE — ED Triage Notes (Signed)
Per EMS:  Pt c/o of abdominal pain with nausea and vomiting for 12 hours.  Vomit is black. Pt also has c/o of chest pain in center of chest.  400 mg of IV zofran given with no relief.  500 cc of NS also given.  Pt appeared diaphoretic.  L bundle branch block noted on EKG.  Pt does not know if that is normal for him.  Pt has a hx of MI, DM, HTN, and has a cardiac stent.

## 2017-08-22 ENCOUNTER — Inpatient Hospital Stay (HOSPITAL_COMMUNITY): Payer: Self-pay | Admitting: Anesthesiology

## 2017-08-22 ENCOUNTER — Inpatient Hospital Stay (HOSPITAL_COMMUNITY): Payer: Self-pay

## 2017-08-22 ENCOUNTER — Encounter (HOSPITAL_COMMUNITY): Payer: Self-pay | Admitting: *Deleted

## 2017-08-22 ENCOUNTER — Encounter (HOSPITAL_COMMUNITY)
Admission: EM | Disposition: A | Discharge status: Home / Self Care | Payer: Self-pay | Source: Home / Self Care | Attending: Family Medicine

## 2017-08-22 DIAGNOSIS — K920 Hematemesis: Secondary | ICD-10-CM

## 2017-08-22 DIAGNOSIS — R079 Chest pain, unspecified: Secondary | ICD-10-CM

## 2017-08-22 HISTORY — PX: ESOPHAGOGASTRODUODENOSCOPY (EGD) WITH PROPOFOL: SHX5813

## 2017-08-22 HISTORY — PX: BIOPSY: SHX5522

## 2017-08-22 LAB — COMPREHENSIVE METABOLIC PANEL
ALT: 13 U/L — ABNORMAL LOW (ref 17–63)
ANION GAP: 8 (ref 5–15)
AST: 21 U/L (ref 15–41)
Albumin: 3.7 g/dL (ref 3.5–5.0)
Alkaline Phosphatase: 42 U/L (ref 38–126)
BUN: 15 mg/dL (ref 6–20)
CHLORIDE: 108 mmol/L (ref 101–111)
CO2: 29 mmol/L (ref 22–32)
Calcium: 10.3 mg/dL (ref 8.9–10.3)
Creatinine, Ser: 1.75 mg/dL — ABNORMAL HIGH (ref 0.61–1.24)
GFR calc non Af Amer: 39 mL/min — ABNORMAL LOW (ref >60)
GFR, EST AFRICAN AMERICAN: 46 mL/min — AB (ref >60)
Glucose, Bld: 120 mg/dL — ABNORMAL HIGH (ref 65–99)
Potassium: 3.8 mmol/L (ref 3.5–5.1)
SODIUM: 145 mmol/L (ref 135–145)
Total Bilirubin: 1.1 mg/dL (ref 0.3–1.2)
Total Protein: 6.2 g/dL — ABNORMAL LOW (ref 6.5–8.1)

## 2017-08-22 LAB — ECHOCARDIOGRAM COMPLETE
Height: 66 in
Weight: 2560 oz

## 2017-08-22 LAB — CBC
HCT: 37.2 % — ABNORMAL LOW (ref 39.0–52.0)
HEMATOCRIT: 38.2 % — AB (ref 39.0–52.0)
Hemoglobin: 12.6 g/dL — ABNORMAL LOW (ref 13.0–17.0)
Hemoglobin: 13.3 g/dL (ref 13.0–17.0)
MCH: 30.4 pg (ref 26.0–34.0)
MCH: 30.9 pg (ref 26.0–34.0)
MCHC: 33.9 g/dL (ref 30.0–36.0)
MCHC: 34.8 g/dL (ref 30.0–36.0)
MCV: 88.6 fL (ref 78.0–100.0)
MCV: 89.6 fL (ref 78.0–100.0)
PLATELETS: 214 10*3/uL (ref 150–400)
PLATELETS: 239 10*3/uL (ref 150–400)
RBC: 4.15 MIL/uL — AB (ref 4.22–5.81)
RBC: 4.31 MIL/uL (ref 4.22–5.81)
RDW: 12.6 % (ref 11.5–15.5)
RDW: 12.4 % (ref 11.5–15.5)
WBC: 9.1 10*3/uL (ref 4.0–10.5)
WBC: 9.4 10*3/uL (ref 4.0–10.5)

## 2017-08-22 LAB — TROPONIN I
Troponin I: <0.03 ng/mL (ref <0.03)
Troponin I: <0.03 ng/mL (ref <0.03)

## 2017-08-22 LAB — ABO/RH: ABO/RH(D): A NEG

## 2017-08-22 LAB — GLUCOSE, CAPILLARY
GLUCOSE-CAPILLARY: 224 mg/dL — AB (ref 65–99)
GLUCOSE-CAPILLARY: 254 mg/dL — AB (ref 65–99)
Glucose-Capillary: 105 mg/dL — ABNORMAL HIGH (ref 65–99)
Glucose-Capillary: 119 mg/dL — ABNORMAL HIGH (ref 65–99)
Glucose-Capillary: 106 mg/dL — ABNORMAL HIGH (ref 65–99)

## 2017-08-22 LAB — APTT: aPTT: 24 seconds (ref 24–36)

## 2017-08-22 LAB — MRSA PCR SCREENING: MRSA by PCR: NEGATIVE

## 2017-08-22 LAB — PROTIME-INR
INR: 1.08
Prothrombin Time: 13.9 seconds (ref 11.4–15.2)

## 2017-08-22 LAB — HIV ANTIBODY (ROUTINE TESTING W REFLEX): HIV SCREEN 4TH GENERATION: NONREACTIVE

## 2017-08-22 SURGERY — ESOPHAGOGASTRODUODENOSCOPY (EGD) WITH PROPOFOL
Anesthesia: General

## 2017-08-22 MED ORDER — LIDOCAINE 2% (20 MG/ML) 5 ML SYRINGE
INTRAMUSCULAR | Status: DC | PRN
  Administered 2017-08-22: 100 mg via INTRAVENOUS

## 2017-08-22 MED ORDER — LISINOPRIL 10 MG PO TABS
10.0000 mg | ORAL_TABLET | Freq: Every day | ORAL | Status: DC
  Administered 2017-08-22: 10 mg via ORAL
  Filled 2017-08-22: qty 1

## 2017-08-22 MED ORDER — PANTOPRAZOLE SODIUM 40 MG PO TBEC
40.0000 mg | DELAYED_RELEASE_TABLET | Freq: Every day | ORAL | Status: DC
  Administered 2017-08-22: 40 mg via ORAL
  Filled 2017-08-22: qty 1

## 2017-08-22 MED ORDER — PANTOPRAZOLE SODIUM 40 MG PO TBEC: 40.0000 mg | DELAYED_RELEASE_TABLET | Freq: Every day | ORAL | 0 refills | Status: AC

## 2017-08-22 MED ORDER — LACTATED RINGERS IV SOLN
INTRAVENOUS | Status: DC
  Administered 2017-08-22: 1000 mL via INTRAVENOUS

## 2017-08-22 MED ORDER — PROPOFOL 500 MG/50ML IV EMUL
INTRAVENOUS | Status: DC | PRN
  Administered 2017-08-22: 125 ug/kg/min via INTRAVENOUS

## 2017-08-22 MED ORDER — PROPOFOL 10 MG/ML IV BOLUS
INTRAVENOUS | Status: DC | PRN
  Administered 2017-08-22: 20 mg via INTRAVENOUS

## 2017-08-22 MED ORDER — PROPOFOL 10 MG/ML IV BOLUS
INTRAVENOUS | Status: AC
  Filled 2017-08-22: qty 60

## 2017-08-22 MED ORDER — SODIUM CHLORIDE 0.9 % IV SOLN: INTRAVENOUS | Status: DC

## 2017-08-22 MED ORDER — GLIPIZIDE 5 MG PO TABS: 5.0000 mg | ORAL_TABLET | Freq: Every day | ORAL | 0 refills | Status: AC

## 2017-08-22 SURGICAL SUPPLY — 15 items

## 2017-08-22 NOTE — Anesthesia Procedure Notes (Signed)
Procedure Name: MAC Date/Time: 08/22/2017 12:23 PM Performed by: Dione Booze, CRNA Pre-anesthesia Checklist: Patient identified, Emergency Drugs available, Suction available and Patient being monitored Patient Re-evaluated:Patient Re-evaluated prior to induction Oxygen Delivery Method: Nasal cannula Placement Confirmation: positive ETCO2

## 2017-08-22 NOTE — Op Note (Signed)
EGD was performed for coffee-ground emesis and abnormal CAT scan with esophageal thickening.   Findings: Few linear, superficial, clean-based ulcers noted in distal esophagus from 30-35 cm, biopsies taken. Mild erythema in antrum, biopsies taken for H. Pylori. Normal retroflexion. Normal-appearing duodenal bulb and duodenum.   Recommendations: Regular diet. PPI once a day. Antireflux measures-avoid smoking, limit alcohol use, space last meal of the day and bedtime by at least 3 hours,elevate head and after back during sleep. Colonoscopy as an outpatient for screening.  Kerin SalenArya Ivory Bail, M.D.

## 2017-08-22 NOTE — Interval H&P Note (Signed)
History and Physical Interval Note: 66/male with coffee ground emesis, esophageal thickening on Ct for a diagnostic EGD.  08/22/2017 12:07 PM  Daniel Savage  has presented today for EGD, with the diagnosis of ugi bleed  The various methods of treatment have been discussed with the patient and family. After consideration of risks, benefits and other options for treatment, the patient has consented to  Procedure(s): ESOPHAGOGASTRODUODENOSCOPY (EGD) WITH PROPOFOL (N/A) as a surgical intervention .  The patient's history has been reviewed, patient examined, no change in status, stable for surgery.  I have reviewed the patient's chart and labs.  Questions were answered to the patient's satisfaction.     Kerin SalenArya Orren Pietsch

## 2017-08-22 NOTE — Transfer of Care (Signed)
Immediate Anesthesia Transfer of Care Note  Patient: Daniel Savage  Procedure(s) Performed: ESOPHAGOGASTRODUODENOSCOPY (EGD) WITH PROPOFOL (N/A )  Patient Location: PACU and Endoscopy Unit  Anesthesia Type:MAC  Level of Consciousness: awake and patient cooperative  Airway & Oxygen Therapy: Patient Spontanous Breathing and Patient connected to nasal cannula oxygen  Post-op Assessment: Report given to RN and Post -op Vital signs reviewed and stable  Post vital signs: Reviewed and stable  Last Vitals:  Vitals Value Taken Time  BP    Temp    Pulse    Resp    SpO2      Last Pain:  Vitals:   08/22/17 1200  TempSrc: Oral  PainSc: 0-No pain         Complications: No apparent anesthesia complications

## 2017-08-22 NOTE — Discharge Summary (Signed)
Physician Discharge Summary  Daniel MouldLouis A Lutterman ZOX:[id number] DOB: 1950/06/09 DOA: 08/21/2017  PCP: Patient, No Pcp Per  Admit date: 08/21/2017 Discharge date: 08/22/2017  Time spent: 36 minutes  Recommendations for Outpatient Follow-up:  1. Serum creatinine above 1.5 as such discontinue metformin on discharge and placed on glipizide. 2. Will hold lisinopril given elevated serum creatinine.  May be continued as outpatient with improvement in serum creatinine levels.   Discharge Diagnoses:  Principal Problem:   Nausea & vomiting Active Problems:   GI bleeding   Diarrhea   Essential hypertension   Diabetes mellitus without complication (HCC)   GERD (gastroesophageal reflux disease)   Discharge Condition: stable  Diet recommendation: Regular diet  Filed Weights   08/21/17 2352 08/22/17 0408 08/22/17 1200  Weight: 69.1 kg (152 lb 5.4 oz) 68.9 kg (151 lb 14.4 oz) 72.6 kg (160 lb)    History of present illness:   67 y.o. male, w Gerd, PUD, hypertension, hyperlipidemia, dm2, apparently c/o n/v, intractable multiple times. w a few episodes of vomitting coffee ground emesis  Hospital Course:  Hematemesis - GI consulted and evaluated with endoscopy Per GI EGD was performed for coffee-ground emesis and abnormal CAT scan with esophageal thickening.  Findings: Few linear, superficial, clean-based ulcers noted in distal esophagus from 30-35 cm, biopsies taken. Mild erythema in antrum, biopsies taken for H. Pylori. Normal retroflexion. Normal-appearing duodenal bulb and duodenum.  Recommendations: Regular diet. PPI once a day. Antireflux measures-avoid smoking, limit alcohol use, space last meal of the day and bedtime by at least 3 hours,elevate head and after back during sleep. Colonoscopy as an outpatient for screening  Hypertension - holding ACE I on d/c  DM - d/c on glipizide and discontinue metformin secondary to elevated serum creatinine levels.  Procedures:  Upper  endoscopy  Consultations:  GI: Dr. Marca AnconaKarki  Discharge Exam: Vitals:   08/22/17 1250 08/22/17 1308  BP: 124/74 133/78  Pulse: 67 61  Resp: (!) 21 14  Temp:  98.2 F (36.8 C)  SpO2: 95% 97%    General: Pt in nad, alert and awake Cardiovascular: rrr, no rubs Respiratory: no increased wob, no wheezes  Discharge Instructions   Discharge Instructions    Call MD for:  temperature >100.4   Complete by:  As directed    Diet - low sodium heart healthy   Complete by:  As directed    Discharge instructions   Complete by:  As directed    Please follow up with your gastroenterologist or primary care after hospital follow up.   Increase activity slowly   Complete by:  As directed      Allergies as of 08/22/2017   No Known Allergies     Medication List    STOP taking these medications   lisinopril 10 MG tablet Commonly known as:  PRINIVIL,ZESTRIL   metFORMIN 500 MG 24 hr tablet Commonly known as:  GLUCOPHAGE-XR   omeprazole 20 MG tablet Commonly known as:  PRILOSEC OTC     TAKE these medications   GARLIC PO Take 1 tablet by mouth daily.   glipiZIDE 5 MG tablet Commonly known as:  GLUCOTROL Take 1 tablet (5 mg total) by mouth daily before breakfast.   pantoprazole 40 MG tablet Commonly known as:  PROTONIX Take 1 tablet (40 mg total) by mouth daily. Start taking on:  08/23/2017      No Known Allergies    The results of significant diagnostics from this hospitalization (including imaging, microbiology, ancillary and laboratory) are  listed below for reference.    Significant Diagnostic Studies: Dg Chest Port 1 View  Result Date: 08/21/2017 CLINICAL DATA:  Chest pain.  Short of breath. EXAM: PORTABLE CHEST 1 VIEW COMPARISON:  12/25/2004 FINDINGS: Normal heart size. Subsegmental atelectasis at the left base. Otherwise clear lungs. No pneumothorax or pleural effusion. Normal vascularity. IMPRESSION: No active disease. Electronically Signed   By: Jolaine Click M.D.    On: 08/21/2017 19:50   Ct Angio Chest/abd/pel For Dissection W And/or Wo Contrast  Result Date: 08/21/2017 CLINICAL DATA:  Chest pain radiating to the back EXAM: CT ANGIOGRAPHY CHEST, ABDOMEN AND PELVIS TECHNIQUE: Multidetector CT imaging through the chest, abdomen and pelvis was performed using the standard protocol during bolus administration of intravenous contrast. Multiplanar reconstructed images and MIPs were obtained and reviewed to evaluate the vascular anatomy. CONTRAST:  ISOVUE-370 IOPAMIDOL (ISOVUE-370) INJECTION 76% COMPARISON:  12/26/2004 FINDINGS: CTA CHEST FINDINGS Cardiovascular: Allowing for motion artifact, there is no evidence of aortic intramural hematoma, aortic dissection, or aortic aneurysm. Atherosclerotic calcification of the aortic arch is noted. There is no evidence of acute pulmonary thromboembolism. Mediastinum/Nodes: No abnormal mediastinal adenopathy. Thyroid is unremarkable. There is wall thickening of the mid and distal esophagus. Lungs/Pleura: No pneumothorax. No pleural effusion. Minimal scattered atelectasis towards the lung bases. 2 mm left lower lobe pulmonary nodule on image 71 of series 6. 5 mm left upper lobe pulmonary nodule on image 18. Both of these nodules are stable compared to the prior study. Musculoskeletal: No vertebral compression deformity. Review of the MIP images confirms the above findings. CTA ABDOMEN AND PELVIS FINDINGS VASCULAR Aorta: Aorta is nonaneurysmal and patent. There is no evidence of dissection. Mild atherosclerotic calcification in the infrarenal aorta. Celiac: Patent.  Branch vessels patent. SMA: Patent.  Branch vessels are grossly patent. Renals: Single renal arteries are patent. IMA: Patent.  Branch vessels are grossly patent. Inflow: Bilateral common iliac, external iliac, and internal iliac arteries are patent. Mild atherosclerotic calcifications at the origin of the right common iliac artery. Review of the MIP images confirms the  above findings. NON-VASCULAR Hepatobiliary: Liver and gallbladder are within normal limits. Pancreas: Unremarkable Spleen: Unremarkable Adrenals/Urinary Tract: There is nonspecific perinephric stranding bilaterally. Renal parenchyma is within normal limits. Adrenal glands are within normal limits. Bladder is unremarkable. Stomach/Bowel: Appendix is within normal limits. No obvious mass in the colon. No evidence of small-bowel obstruction. Stomach is within normal limits. Lymphatic: No evidence of abnormal retroperitoneal adenopathy. Reproductive: Prostate is upper normal in size. Other: No free fluid. Musculoskeletal: There is no vertebral compression deformity. Review of the MIP images confirms the above findings. IMPRESSION: Vascular: There is no evidence of aortic dissection. There is no acute vascular pathology. Nonvascular: There is wall thickening in the mid and distal esophagus. Findings may represent inflammation from reflux. Inflammatory process is not excluded. Nonspecific bilateral perinephric stranding. Correlation with urinalysis is recommended. Electronically Signed   By: Jolaine Click M.D.   On: 08/21/2017 21:12    Microbiology: Recent Results (from the past 240 hour(s))  MRSA PCR Screening     Status: None   Collection Time: 08/22/17  4:27 AM  Result Value Ref Range Status   MRSA by PCR NEGATIVE NEGATIVE Final    Comment:        The GeneXpert MRSA Assay (FDA approved for NASAL specimens only), is one component of a comprehensive MRSA colonization surveillance program. It is not intended to diagnose MRSA infection nor to guide or monitor treatment for MRSA infections.  Performed at [facility], [address], [place], [place] [address]      Labs: Basic Metabolic Panel: Recent Labs  Lab 08/21/17 1847 08/21/17 1956 08/21/17 2015 08/22/17 0726  NA PATIENT IDENTIFICATION ERROR. PLEASE DISREGARD RESULTS. ACCOUNT WILL BE CREDITED. 143 141 145  K PATIENT  IDENTIFICATION ERROR. PLEASE DISREGARD RESULTS. ACCOUNT WILL BE CREDITED. 4.6 4.1 3.8  CL PATIENT IDENTIFICATION ERROR. PLEASE DISREGARD RESULTS. ACCOUNT WILL BE CREDITED. 103 101 108  CO2 PATIENT IDENTIFICATION ERROR. PLEASE DISREGARD RESULTS. ACCOUNT WILL BE CREDITED. 30  --  29  GLUCOSE PATIENT IDENTIFICATION ERROR. PLEASE DISREGARD RESULTS. ACCOUNT WILL BE CREDITED. 278* 274* 120*  BUN PATIENT IDENTIFICATION ERROR. PLEASE DISREGARD RESULTS. ACCOUNT WILL BE CREDITED. 15 14 15   CREATININE PATIENT IDENTIFICATION ERROR. PLEASE DISREGARD RESULTS. ACCOUNT WILL BE CREDITED. 1.49* 1.30* 1.75*  CALCIUM PATIENT IDENTIFICATION ERROR. PLEASE DISREGARD RESULTS. ACCOUNT WILL BE CREDITED. 12.1*  --  10.3   Liver Function Tests: Recent Labs  Lab 08/21/17 1847 08/21/17 1956 08/22/17 0726  AST PATIENT IDENTIFICATION ERROR. PLEASE DISREGARD RESULTS. ACCOUNT WILL BE CREDITED. 22 21  ALT PATIENT IDENTIFICATION ERROR. PLEASE DISREGARD RESULTS. ACCOUNT WILL BE CREDITED. 19 13*  ALKPHOS PATIENT IDENTIFICATION ERROR. PLEASE DISREGARD RESULTS. ACCOUNT WILL BE CREDITED. 50 42  BILITOT PATIENT IDENTIFICATION ERROR. PLEASE DISREGARD RESULTS. ACCOUNT WILL BE CREDITED. 1.0 1.1  PROT PATIENT IDENTIFICATION ERROR. PLEASE DISREGARD RESULTS. ACCOUNT WILL BE CREDITED. 7.4 6.2*  ALBUMIN PATIENT IDENTIFICATION ERROR. PLEASE DISREGARD RESULTS. ACCOUNT WILL BE CREDITED. 4.5 3.7   Recent Labs  Lab 08/21/17 1847 08/21/17 1956  LIPASE PATIENT IDENTIFICATION ERROR. PLEASE DISREGARD RESULTS. ACCOUNT WILL BE CREDITED. 36   No results for input(s): AMMONIA in the last 168 hours. CBC: Recent Labs  Lab 08/21/17 1847 08/21/17 1956 08/21/17 2015 08/22/17 0023 08/22/17 0726  WBC PATIENT IDENTIFICATION ERROR. PLEASE DISREGARD RESULTS. ACCOUNT WILL BE CREDITED. 13.5*  --  9.4 9.1  NEUTROABS PATIENT IDENTIFICATION ERROR. PLEASE DISREGARD RESULTS. ACCOUNT WILL BE CREDITED. 12.2*  --   --   --   HGB PATIENT IDENTIFICATION ERROR.  PLEASE DISREGARD RESULTS. ACCOUNT WILL BE CREDITED. 14.2 15.0 13.3 12.6*  HCT PATIENT IDENTIFICATION ERROR. PLEASE DISREGARD RESULTS. ACCOUNT WILL BE CREDITED. 41.9 44.0 38.2* 37.2*  MCV PATIENT IDENTIFICATION ERROR. PLEASE DISREGARD RESULTS. ACCOUNT WILL BE CREDITED. 89.9  --  88.6 89.6  PLT PATIENT IDENTIFICATION ERROR. PLEASE DISREGARD RESULTS. ACCOUNT WILL BE CREDITED. 251  --  239 214   Cardiac Enzymes: Recent Labs  Lab 08/21/17 1847 08/21/17 1956 08/22/17 0023 08/22/17 0726 08/22/17 1312  CKTOTAL PATIENT IDENTIFICATION ERROR. PLEASE DISREGARD RESULTS. ACCOUNT WILL BE CREDITED. 72  --   --   --   TROPONINI  --   --  <0.03 <0.03 <0.03   BNP: BNP (last 3 results) No results for input(s): BNP in the last 8760 hours.  ProBNP (last 3 results) No results for input(s): PROBNP in the last 8760 hours.  CBG: Recent Labs  Lab 08/21/17 2320 08/22/17 0007 08/22/17 0404 08/22/17 0740 08/22/17 1122  GLUCAP 254* 224* 105* 119* 106*       Signed:  Penny Pia MD.  [facility] 08/22/2017, 2:22 PM

## 2017-08-22 NOTE — Progress Notes (Signed)
   08/22/17 1000  Clinical Encounter Type  Visited With Patient and family together  Visit Type Initial;Psychological support;Spiritual support  Referral From Nurse  Consult/Referral To Chaplain  Spiritual Encounters  Spiritual Needs Brochure;Other (Comment) (Advance Directive Completion )  Stress Factors  Patient Stress Factors Other (Comment)  Family Stress Factors Other (Comment)  Advance Directives (For Healthcare)  Does Patient Have a Medical Advance Directive? Yes  Type of Advance Directive Healthcare Power of Attorney   I visited with the patient per Spiritual Care consult for an Advance Directive.  I provided the paperwork and education for an Advance Directive.  The paperwork was completed and notarized.   Copy is in patient's chart.   Chaplain Clint BolderBrittany Arieh Bogue M.Div., [facility]

## 2017-08-22 NOTE — Anesthesia Preprocedure Evaluation (Addendum)
Anesthesia Evaluation  Patient identified by MRN, date of birth, ID band Patient awake    Reviewed: Allergy & Precautions, H&P , NPO status , Patient's Chart, lab work & pertinent test results, reviewed documented beta blocker date and time   Airway Mallampati: II  TM Distance: >3 FB Neck ROM: full    Dental no notable dental hx. (+) Edentulous Upper, Partial Lower, Poor Dentition, Missing,    Pulmonary Current Smoker,    Pulmonary exam normal breath sounds clear to auscultation       Cardiovascular Exercise Tolerance: Good hypertension, Pt. on medications  Rhythm:regular Rate:Normal     Neuro/Psych negative psych ROS   GI/Hepatic PUD, GERD  Medicated,  Endo/Other  diabetes, Oral Hypoglycemic Agents  Renal/GU      Musculoskeletal   Abdominal   Peds  Hematology   Anesthesia Other Findings   Reproductive/Obstetrics                            Anesthesia Physical Anesthesia Plan  ASA: II  Anesthesia Plan: General   Post-op Pain Management:    Induction: Intravenous  PONV Risk Score and Plan: 2 and Treatment may vary due to age or medical condition  Airway Management Planned: Mask, Natural Airway and Nasal Cannula  Additional Equipment:   Intra-op Plan:   Post-operative Plan:   Informed Consent: I have reviewed the patients History and Physical, chart, labs and discussed the procedure including the risks, benefits and alternatives for the proposed anesthesia with the patient or authorized representative who has indicated his/her understanding and acceptance.   Dental Advisory Given  Plan Discussed with: CRNA, Anesthesiologist and Surgeon  Anesthesia Plan Comments:         Anesthesia Quick Evaluation

## 2017-08-22 NOTE — Anesthesia Postprocedure Evaluation (Signed)
Anesthesia Post Note  Patient: Daniel Savage  Procedure(s) Performed: ESOPHAGOGASTRODUODENOSCOPY (EGD) WITH PROPOFOL (N/A ) BIOPSY     Patient location during evaluation: PACU Anesthesia Type: General Level of consciousness: awake and alert Pain management: pain level controlled Vital Signs Assessment: post-procedure vital signs reviewed and stable Respiratory status: spontaneous breathing, nonlabored ventilation, respiratory function stable and patient connected to nasal cannula oxygen Cardiovascular status: blood pressure returned to baseline and stable Postop Assessment: no apparent nausea or vomiting Anesthetic complications: no    Last Vitals:  Vitals:   08/22/17 1250 08/22/17 1308  BP: 124/74 133/78  Pulse: 67 61  Resp: (!) 21 14  Temp:  36.8 C  SpO2: 95% 97%    Last Pain:  Vitals:   08/22/17 1308  TempSrc: Oral  PainSc:                  Wilson Dusenbery

## 2017-08-22 NOTE — H&P (View-Only) (Signed)
Subjective:   HPI  The patient is a 67-year-old male with a history of gastroesophageal reflux. He has been on Prilosec for years. He quit taking Prilosec a few days ago to see how he would do and then yesterday about 2 PM started having a lot of violent vomiting. He vomited up some coffee-ground-appearing material. He presented to the hospital and was admitted. He had a little diarrhea also but none today.denies NSAIDs.  Review of Systems No chest pain or shortness of breath  Past Medical History:  Diagnosis Date  . Diabetes mellitus without complication (HCC)   . GERD (gastroesophageal reflux disease)   . Hyperlipidemia   . Hypertension   . PUD (peptic ulcer disease)    Past Surgical History:  Procedure Laterality Date  . CARDIAC CATHETERIZATION    . TONSILLECTOMY     Social History   Socioeconomic History  . Marital status: Single    Spouse name: Not on file  . Number of children: Not on file  . Years of education: Not on file  . Highest education level: Not on file  Occupational History  . Not on file  Social Needs  . Financial resource strain: Not on file  . Food insecurity:    Worry: Not on file    Inability: Not on file  . Transportation needs:    Medical: Not on file    Non-medical: Not on file  Tobacco Use  . Smoking status: Current Every Day Smoker    Types: Cigarettes  . Smokeless tobacco: Never Used  Substance and Sexual Activity  . Alcohol use: Not Currently  . Drug use: Not on file  . Sexual activity: Not on file  Lifestyle  . Physical activity:    Days per week: Not on file    Minutes per session: Not on file  . Stress: Not on file  Relationships  . Social connections:    Talks on phone: Not on file    Gets together: Not on file    Attends religious service: Not on file    Active member of club or organization: Not on file    Attends meetings of clubs or organizations: Not on file    Relationship status: Not on file  . Intimate partner  violence:    Fear of current or ex partner: Not on file    Emotionally abused: Not on file    Physically abused: Not on file    Forced sexual activity: Not on file  Other Topics Concern  . Not on file  Social History Narrative  . Not on file   family history includes CAD in his father.  Current Facility-Administered Medications:  .  0.9 %  sodium chloride infusion, , Intravenous, Continuous, Kim, James, MD, Last Rate: 75 mL/hr at 08/22/17 0000 .  acetaminophen (TYLENOL) tablet 650 mg, 650 mg, Oral, Q6H PRN **OR** acetaminophen (TYLENOL) suppository 650 mg, 650 mg, Rectal, Q6H PRN, Kim, James, MD .  insulin aspart (novoLOG) injection 0-9 Units, 0-9 Units, Subcutaneous, Q4H, Kim, James, MD, 5 Units at 08/21/17 2331 .  ondansetron (ZOFRAN) injection 4 mg, 4 mg, Intravenous, Q6H PRN, Kim, James, MD .  pantoprazole (PROTONIX) 80 mg in sodium chloride 0.9 % 250 mL (0.32 mg/mL) infusion, 8 mg/hr, Intravenous, Continuous, Kim, James, MD, Last Rate: 25 mL/hr at 08/22/17 0125, 8 mg/hr at 08/22/17 0125 No Known Allergies   Objective:     BP 110/65 (BP Location: Right Arm)   Pulse (!) 55   Temp   98.3 F (36.8 C) (Oral)   Resp 18   Ht 5' 6" (1.676 m)   Wt 68.9 kg (151 lb 14.4 oz)   SpO2 98%   BMI 24.52 kg/m   No distress  Heart regular rhythm no murmurs  Lungs clear  Abdomen soft and nontender    Laboratory No components found for: D1    Assessment:     Gastroesophageal reflux  Coffee-ground emesis  Possible Mallory-Weiss tear versus esophagitis      Plan:     PPI therapy. EGD later today. 

## 2017-08-22 NOTE — Op Note (Signed)
[facility] Patient Name: Daniel Savage Procedure Date: 08/22/2017 MRN: [id number] Attending MD: Kerin Salen , MD Date of Birth: November 05, 1950 CSN: [id number] Age: 67 Admit Type: Inpatient Procedure:                Upper GI endoscopy Indications:              Heartburn, Suspected gastro-esophageal reflux                            disease, Coffee-ground emesis, Abnormal CT of the                            GI tract Providers:                Kerin Salen, MD, Norman Clay, RN, Zoila Shutter,                            Technician, Delphia Grates, CRNA Referring MD:              Medicines:                Monitored Anesthesia Care Complications:            No immediate complications. Estimated Blood Loss:     Estimated blood loss: none. Procedure:                Pre-Anesthesia Assessment:                           - Prior to the procedure, a History and Physical                            was performed, and patient medications and                            allergies were reviewed. The patient's tolerance of                            previous anesthesia was also reviewed. The risks                            and benefits of the procedure and the sedation                            options and risks were discussed with the patient.                            All questions were answered, and informed consent                            was obtained. Prior Anticoagulants: The patient has                            taken no previous anticoagulant or antiplatelet                            agents. ASA Grade  Assessment: III - A patient with                            severe systemic disease. After reviewing the risks                            and benefits, the patient was deemed in                            satisfactory condition to undergo the procedure.                           After obtaining informed consent, the endoscope was                            passed under direct  vision. Throughout the                            procedure, the patient's blood pressure, pulse, and                            oxygen saturations were monitored continuously. The                            EG-[id number] ([id number]) scope was introduced through the                            mouth, and advanced to the second part of duodenum.                            The upper GI endoscopy was accomplished without                            difficulty. The patient tolerated the procedure                            well. Scope In: Scope Out: Findings:      Few linear and superficial esophageal ulcers with no bleeding and no       stigmata of recent bleeding were found 30 to 35 cm from the incisors.       The largest lesion was 15 mm in largest dimension. Biopsies were taken       with a cold forceps for histology.      The Z-line was variable and was found 35 cm from the incisors.      Localized mildly erythematous mucosa without bleeding was found in the       gastric antrum. Biopsies were taken with a cold forceps for Helicobacter       pylori testing.      The cardia and gastric fundus were normal on retroflexion.      The examined duodenum was normal. Impression:               - Non-bleeding esophageal ulcers. Biopsied.                           -  Z-line variable, 35 cm from the incisors.                           - Erythematous mucosa in the antrum. Biopsied.                           - Normal examined duodenum. Moderate Sedation:      Patient did not receive moderate sedation for this procedure, but       instead received monitored anesthesia care. Recommendation:           - Resume regular diet.                           - Continue present medications.                           - Await pathology results.                           - Aggressive anti reflux measures such as weight                            loss, elevate head end of the bed during sleep,                             avoid or limit caffeinated products and space last                            meal of the day and bedtime by at least 3 hours. Procedure Code(s):        --- Professional ---                           [id number], Esophagogastroduodenoscopy, flexible,                            transoral; with biopsy, single or multiple Diagnosis Code(s):        --- Professional ---                           K22.10, Ulcer of esophagus without bleeding                           K22.8, Other specified diseases of esophagus                           K31.89, Other diseases of stomach and duodenum                           R12, Heartburn                           K92.0, Hematemesis                           R93.3, Abnormal findings on diagnostic imaging of  other parts of digestive tract CPT copyright 2017 [employer]. All rights reserved. The codes documented in this report are preliminary and upon coder review may  be revised to meet current compliance requirements. Kerin SalenArya Nadya Hopwood, MD 08/22/2017 12:39:56 PM This report has been signed electronically. Number of Addenda: 0

## 2017-08-22 NOTE — Consult Note (Signed)
Subjective:   HPI  The patient is a 67 year old male with a history of gastroesophageal reflux. He has been on Prilosec for years. He quit taking Prilosec a few days ago to see how he would do and then yesterday about 2 PM started having a lot of violent vomiting. He vomited up some coffee-ground-appearing material. He presented to the hospital and was admitted. He had a little diarrhea also but none today.denies NSAIDs.  Review of Systems No chest pain or shortness of breath  Past Medical History:  Diagnosis Date  . Diabetes mellitus without complication (HCC)   . GERD (gastroesophageal reflux disease)   . Hyperlipidemia   . Hypertension   . PUD (peptic ulcer disease)    Past Surgical History:  Procedure Laterality Date  . CARDIAC CATHETERIZATION    . TONSILLECTOMY     Social History   Socioeconomic History  . Marital status: Single    Spouse name: Not on file  . Number of children: Not on file  . Years of education: Not on file  . Highest education level: Not on file  Occupational History  . Not on file  Social Needs  . Financial resource strain: Not on file  . Food insecurity:    Worry: Not on file    Inability: Not on file  . Transportation needs:    Medical: Not on file    Non-medical: Not on file  Tobacco Use  . Smoking status: Current Every Day Smoker    Types: Cigarettes  . Smokeless tobacco: Never Used  Substance and Sexual Activity  . Alcohol use: Not Currently  . Drug use: Not on file  . Sexual activity: Not on file  Lifestyle  . Physical activity:    Days per week: Not on file    Minutes per session: Not on file  . Stress: Not on file  Relationships  . Social connections:    Talks on phone: Not on file    Gets together: Not on file    Attends religious service: Not on file    Active member of club or organization: Not on file    Attends meetings of clubs or organizations: Not on file    Relationship status: Not on file  . Intimate partner  violence:    Fear of current or ex partner: Not on file    Emotionally abused: Not on file    Physically abused: Not on file    Forced sexual activity: Not on file  Other Topics Concern  . Not on file  Social History Narrative  . Not on file   family history includes CAD in his father.  Current Facility-Administered Medications:  .  0.9 %  sodium chloride infusion, , Intravenous, Continuous, Pearson GrippeKim, James, MD, Last Rate: 75 mL/hr at 08/22/17 0000 .  acetaminophen (TYLENOL) tablet 650 mg, 650 mg, Oral, Q6H PRN **OR** acetaminophen (TYLENOL) suppository 650 mg, 650 mg, Rectal, Q6H PRN, Pearson GrippeKim, James, MD .  insulin aspart (novoLOG) injection 0-9 Units, 0-9 Units, Subcutaneous, Q4H, Pearson GrippeKim, James, MD, 5 Units at 08/21/17 2331 .  ondansetron (ZOFRAN) injection 4 mg, 4 mg, Intravenous, Q6H PRN, Pearson GrippeKim, James, MD .  pantoprazole (PROTONIX) 80 mg in sodium chloride 0.9 % 250 mL (0.32 mg/mL) infusion, 8 mg/hr, Intravenous, Continuous, Pearson GrippeKim, James, MD, Last Rate: 25 mL/hr at 08/22/17 0125, 8 mg/hr at 08/22/17 0125 No Known Allergies   Objective:     BP 110/65 (BP Location: Right Arm)   Pulse (!) 55   Temp  98.3 F (36.8 C) (Oral)   Resp 18   Ht 5\' 6"  (1.676 m)   Wt 68.9 kg (151 lb 14.4 oz)   SpO2 98%   BMI 24.52 kg/m   No distress  Heart regular rhythm no murmurs  Lungs clear  Abdomen soft and nontender    Laboratory No components found for: D1    Assessment:     Gastroesophageal reflux  Coffee-ground emesis  Possible Mallory-Weiss tear versus esophagitis      Plan:     PPI therapy. EGD later today.

## 2017-08-22 NOTE — Progress Notes (Signed)
  Echocardiogram 2D Echocardiogram has been performed.  Makani Seckman L Androw 08/22/2017, 2:49 PM

## 2017-08-22 NOTE — Brief Op Note (Signed)
08/21/2017 - 08/22/2017  12:40 PM  PATIENT:  Daniel Savage  67 y.o. male  PRE-OPERATIVE DIAGNOSIS:  ugi bleed  POST-OPERATIVE DIAGNOSIS:  esophageal linear ulcers  PROCEDURE:  Procedure(s): ESOPHAGOGASTRODUODENOSCOPY (EGD) WITH PROPOFOL (N/A)  SURGEON:  Surgeon(s) and Role:    Ronnette Juniper, MD - Primary  PHYSICIAN ASSISTANT:   ASSISTANTS: Burtis Junes, RN, Tinnie Gens, Tech ANESTHESIA:   MAC  EBL:  Minimal  BLOOD ADMINISTERED:none  DRAINS: none   LOCAL MEDICATIONS USED:  NONE  SPECIMEN:  Biopsy / Limited Resection  DISPOSITION OF SPECIMEN:  PATHOLOGY  COUNTS:  YES  TOURNIQUET:  * No tourniquets in log *  DICTATION: .Dragon Dictation  PLAN OF CARE: Admit to inpatient   PATIENT DISPOSITION:  PACU - hemodynamically stable.   Delay start of Pharmacological VTE agent (>24hrs) due to surgical blood loss or risk of bleeding: no

## 2017-08-23 ENCOUNTER — Encounter (HOSPITAL_COMMUNITY): Payer: Self-pay | Admitting: Gastroenterology

## 2017-08-23 LAB — CALCIUM, IONIZED: CALCIUM, IONIZED, SERUM: 5.8 mg/dL — AB (ref 4.5–5.6)

## 2017-08-23 NOTE — Anesthesia Postprocedure Evaluation (Signed)
Anesthesia Post Note  Patient: Daniel Savage  Procedure(s) Performed: ESOPHAGOGASTRODUODENOSCOPY (EGD) WITH PROPOFOL (N/A ) BIOPSY     Patient location during evaluation: PACU Anesthesia Type: MAC Level of consciousness: awake and alert Pain management: pain level controlled Vital Signs Assessment: post-procedure vital signs reviewed and stable Respiratory status: spontaneous breathing, nonlabored ventilation, respiratory function stable and patient connected to nasal cannula oxygen Cardiovascular status: stable and blood pressure returned to baseline Postop Assessment: no apparent nausea or vomiting Anesthetic complications: no    Last Vitals:  Vitals:   08/22/17 1250 08/22/17 1308  BP: 124/74 133/78  Pulse: 67 61  Resp: (!) 21 14  Temp:  36.8 C  SpO2: 95% 97%    Last Pain:  Vitals:   08/22/17 1308  TempSrc: Oral  PainSc:                  Srija Southard

## 2017-08-23 NOTE — Addendum Note (Signed)
Addendum  created 08/23/17 1357 by Bethena Midgetddono, Alechia Lezama, MD   Sign clinical note

## 2019-05-17 IMAGING — CT CT ANGIO CHEST-ABD-PELV FOR DISSECTION W/ AND WO/W CM
2 of 7 series · 15 of 46 positions shown, 17 images · IV contrast (iopamidol)
Comparison: 12/26/2004

CLINICAL DATA: Chest pain radiating to the back

EXAM:
CT ANGIOGRAPHY CHEST, ABDOMEN AND PELVIS
TECHNIQUE: Multidetector CT imaging through the chest, abdomen and pelvis was
performed using the standard protocol during bolus administration of
intravenous contrast. Multiplanar reconstructed images and MIPs were
obtained and reviewed to evaluate the vascular anatomy.
CONTRAST:  100mL D6QPLX-J2J IOPAMIDOL (D6QPLX-J2J) INJECTION 76%

[Series 5: axial arterial · axial · arterial · 0.86mm/px · z∈[-461,+40]mm · 12 of 193 slices shown, 14 images]
[im 13/193  soft-tissue]
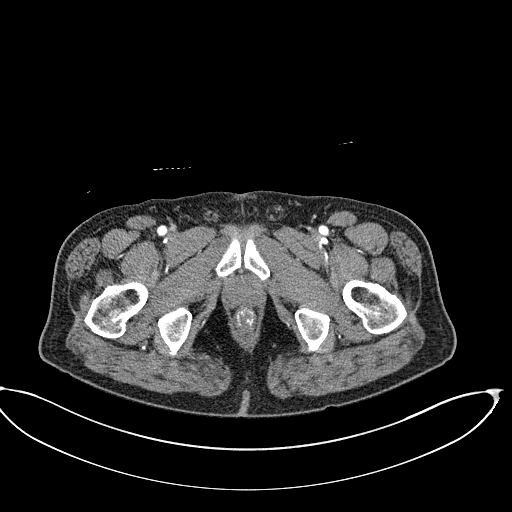
[im 13/193  bone]
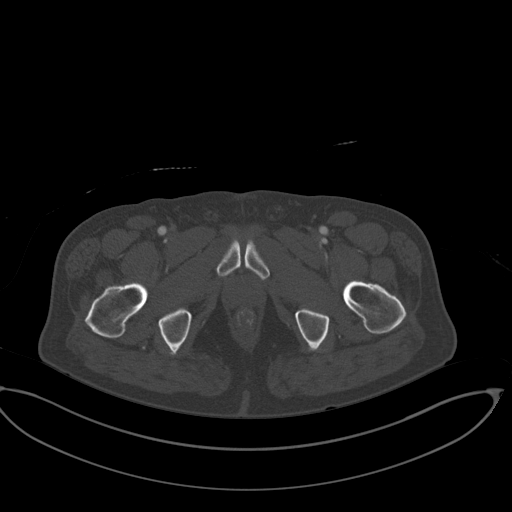
[im 26/193  soft-tissue]
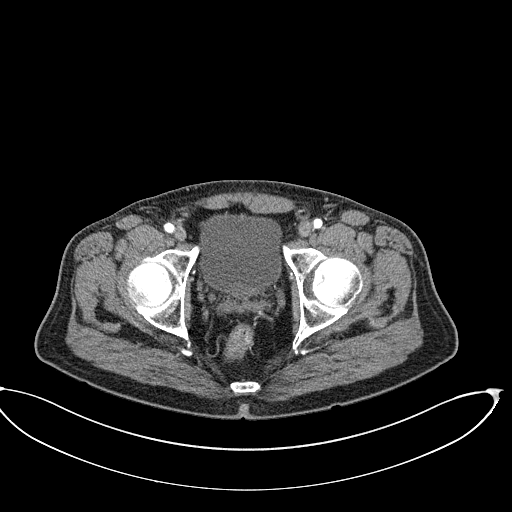
[im 39/193  soft-tissue]
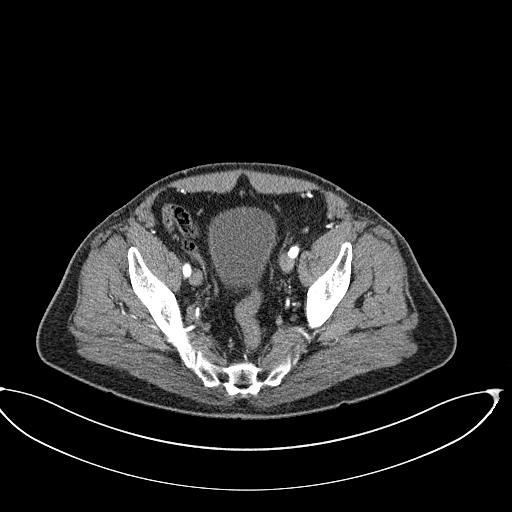
[im 65/193  soft-tissue]
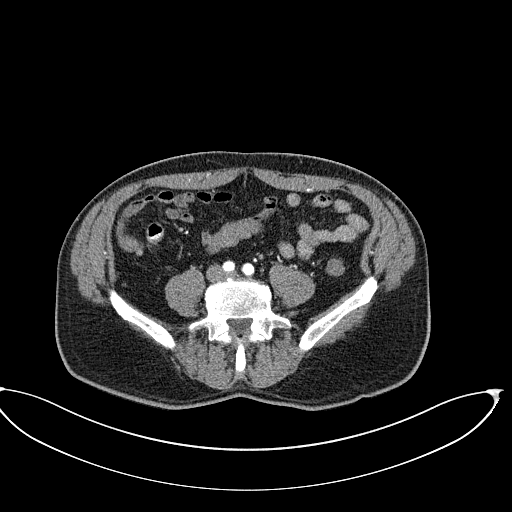
[im 77/193  soft-tissue]
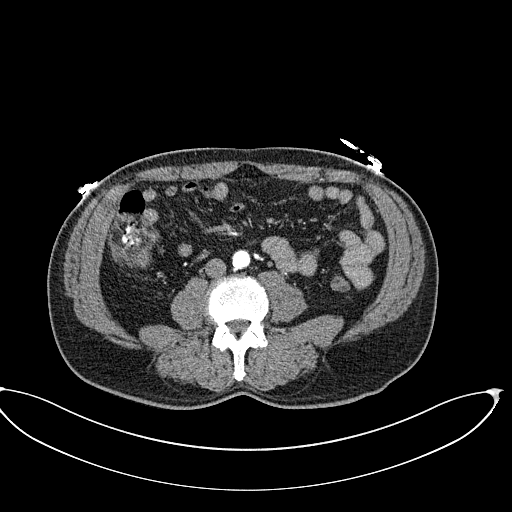
[im 90/193  soft-tissue]
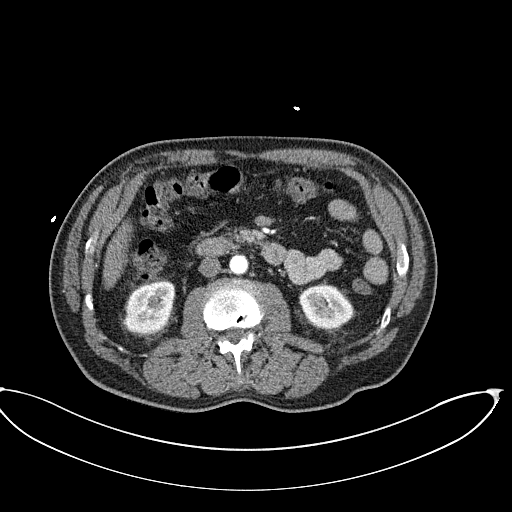
[im 103/193  soft-tissue]
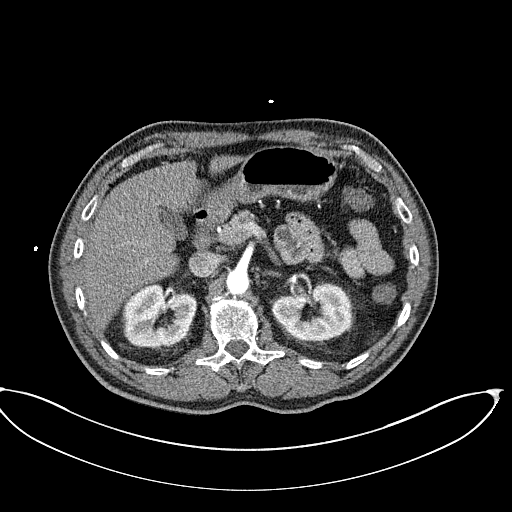
[im 116/193  soft-tissue]
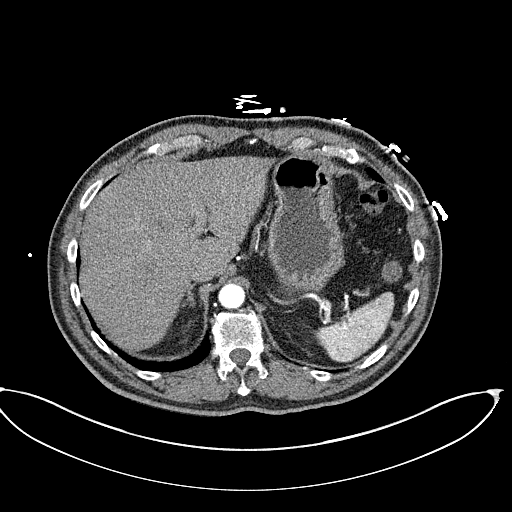
[im 129/193  soft-tissue]
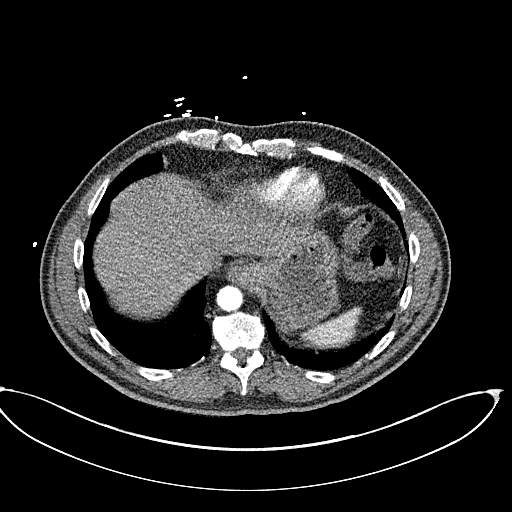
[im 129/193  bone]
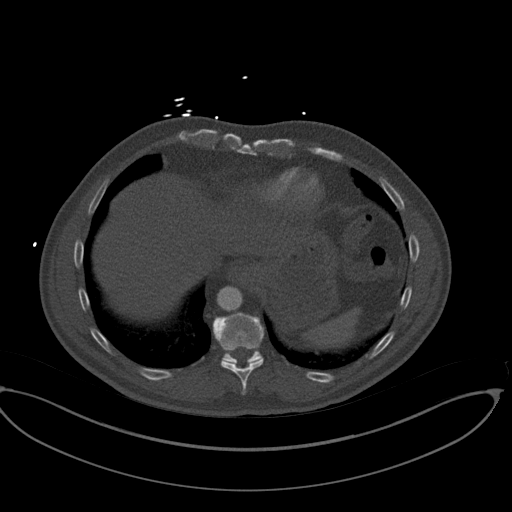
[im 154/193  soft-tissue]
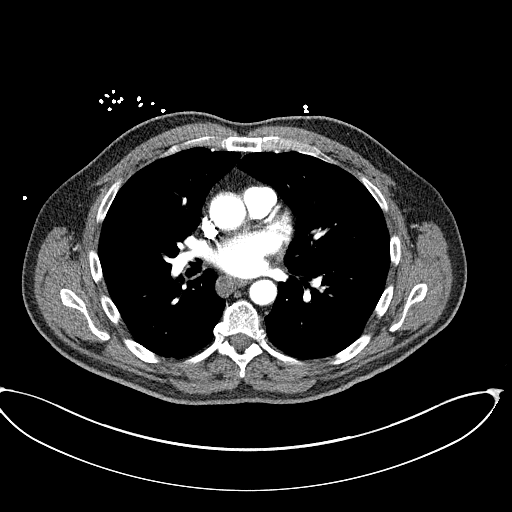
[im 167/193  soft-tissue]
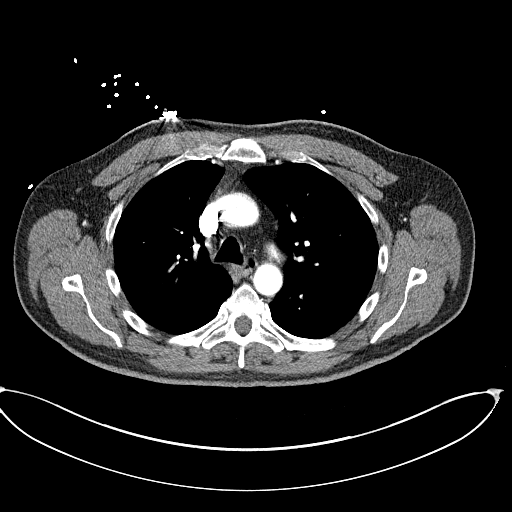
[im 180/193  soft-tissue]
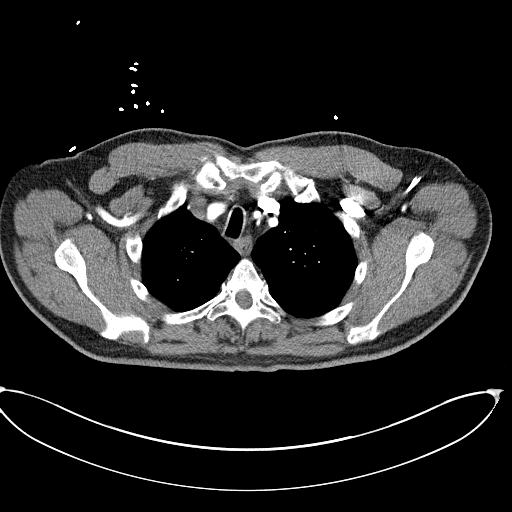

[Series 9: coronals · coronal · 0.92mm/px · 3 of 162 slices shown]
[im 41/162  soft-tissue]
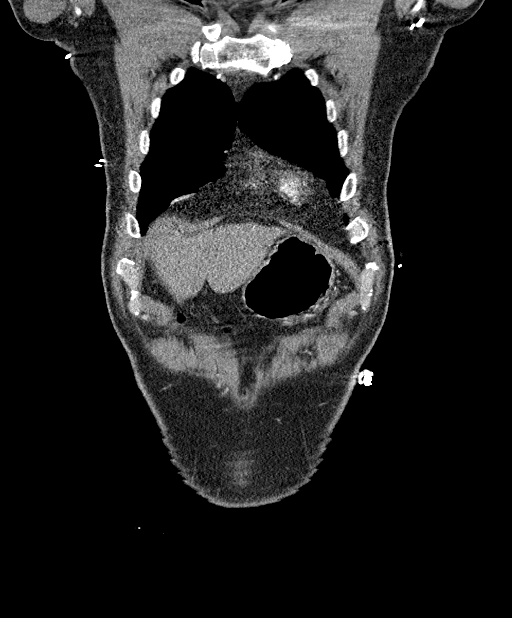
[im 81/162  soft-tissue]
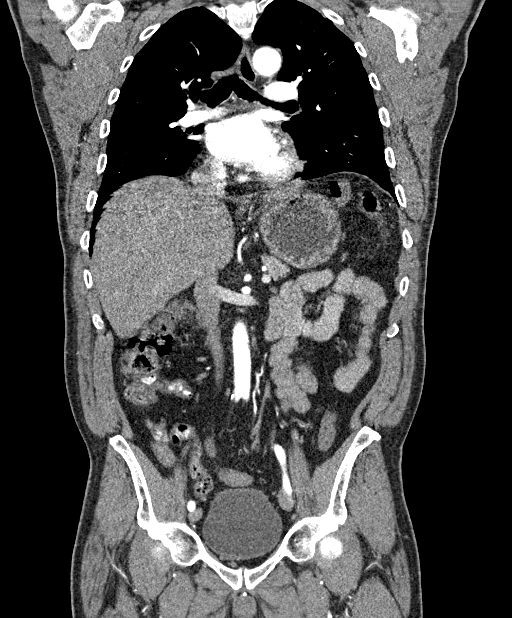
[im 121/162  soft-tissue]
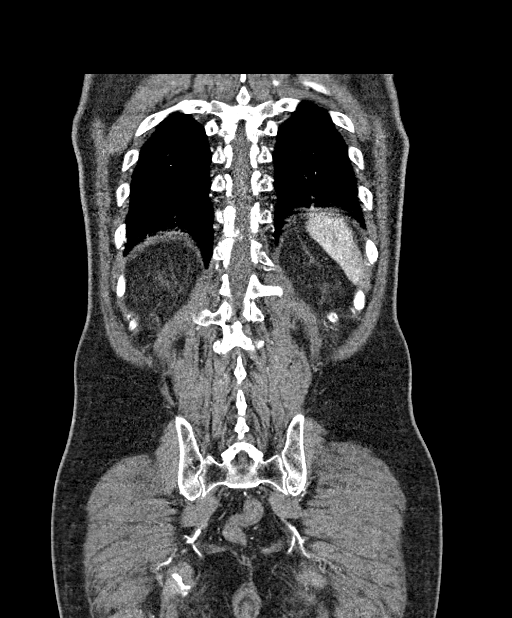

[15 of 46 positions shown; findings below may reference images not displayed]

FINDINGS: CTA CHEST FINDINGS

Cardiovascular: Allowing for motion artifact, there is no evidence
of aortic intramural hematoma, aortic dissection, or aortic
aneurysm. Atherosclerotic calcification of the aortic arch is noted.
There is no evidence of acute pulmonary thromboembolism.

Mediastinum/Nodes: No abnormal mediastinal adenopathy. Thyroid is
unremarkable. There is wall thickening of the mid and distal
esophagus.

Lungs/Pleura: No pneumothorax. No pleural effusion. Minimal
scattered atelectasis towards the lung bases. 2 mm left lower lobe
pulmonary nodule on image 71 of series 6. 5 mm left upper lobe
pulmonary nodule on image 18. Both of these nodules are stable
compared to the prior study.

Musculoskeletal: No vertebral compression deformity.

Review of the MIP images confirms the above findings.

CTA ABDOMEN AND PELVIS FINDINGS

VASCULAR

Aorta: Aorta is nonaneurysmal and patent. There is no evidence of
dissection. Mild atherosclerotic calcification in the infrarenal
aorta.

Celiac: Patent.  Branch vessels patent.

SMA: Patent.  Branch vessels are grossly patent.

Renals: Single renal arteries are patent.

IMA: Patent.  Branch vessels are grossly patent.

Inflow: Bilateral common iliac, external iliac, and internal iliac
arteries are patent. Mild atherosclerotic calcifications at the
origin of the right common iliac artery.

Review of the MIP images confirms the above findings.

NON-VASCULAR

Hepatobiliary: Liver and gallbladder are within normal limits.

Pancreas: Unremarkable

Spleen: Unremarkable

Adrenals/Urinary Tract: There is nonspecific perinephric stranding
bilaterally. Renal parenchyma is within normal limits. Adrenal
glands are within normal limits. Bladder is unremarkable.

Stomach/Bowel: Appendix is within normal limits. No obvious mass in
the colon. No evidence of small-bowel obstruction. Stomach is within
normal limits.

Lymphatic: No evidence of abnormal retroperitoneal adenopathy.

Reproductive: Prostate is upper normal in size.

Other: No free fluid.

Musculoskeletal: There is no vertebral compression deformity.

Review of the MIP images confirms the above findings.
IMPRESSION: Vascular:

There is no evidence of aortic dissection. There is no acute
vascular pathology.

Nonvascular:

There is wall thickening in the mid and distal esophagus. Findings
may represent inflammation from reflux. Inflammatory process is not
excluded.

Nonspecific bilateral perinephric stranding. Correlation with
urinalysis is recommended.

## 2023-11-18 ENCOUNTER — Other Ambulatory Visit: Payer: Self-pay

## 2023-11-18 ENCOUNTER — Ambulatory Visit
Admission: RE | Admit: 2023-11-18 | Discharge: 2023-11-18 | Disposition: A | Discharge status: Home / Self Care | Source: Ambulatory Visit

## 2023-11-18 DIAGNOSIS — S6991XA Unspecified injury of right wrist, hand and finger(s), initial encounter: Secondary | ICD-10-CM

## 2023-12-09 ENCOUNTER — Other Ambulatory Visit: Payer: Self-pay

## 2023-12-09 DIAGNOSIS — Z122 Encounter for screening for malignant neoplasm of respiratory organs: Secondary | ICD-10-CM

## 2023-12-18 ENCOUNTER — Ambulatory Visit
Admission: RE | Admit: 2023-12-18 | Discharge: 2023-12-18 | Disposition: A | Discharge status: Home / Self Care | Source: Ambulatory Visit

## 2023-12-18 DIAGNOSIS — Z122 Encounter for screening for malignant neoplasm of respiratory organs: Secondary | ICD-10-CM

## 2024-01-15 ENCOUNTER — Emergency Department (HOSPITAL_COMMUNITY)
Admission: EM | Admit: 2024-01-15 | Discharge: 2024-01-15 | Disposition: A | Discharge status: Home / Self Care | Attending: Emergency Medicine | Admitting: Emergency Medicine

## 2024-01-15 ENCOUNTER — Other Ambulatory Visit: Payer: Self-pay

## 2024-01-15 ENCOUNTER — Encounter (HOSPITAL_COMMUNITY): Payer: Self-pay

## 2024-01-15 ENCOUNTER — Emergency Department (HOSPITAL_COMMUNITY)

## 2024-01-15 DIAGNOSIS — R0602 Shortness of breath: Secondary | ICD-10-CM | POA: Insufficient documentation

## 2024-01-15 DIAGNOSIS — R062 Wheezing: Secondary | ICD-10-CM | POA: Insufficient documentation

## 2024-01-15 DIAGNOSIS — R0789 Other chest pain: Secondary | ICD-10-CM | POA: Diagnosis present

## 2024-01-15 LAB — BASIC METABOLIC PANEL WITH GFR
Anion gap: 12 (ref 5–15)
BUN: 13 mg/dL (ref 8–23)
CO2: 24 mmol/L (ref 22–32)
Calcium: 10.4 mg/dL — ABNORMAL HIGH (ref 8.9–10.3)
Chloride: 101 mmol/L (ref 98–111)
Creatinine, Ser: 1.29 mg/dL — ABNORMAL HIGH (ref 0.61–1.24)
GFR, Estimated: 60 mL/min — ABNORMAL LOW (ref >60)
Glucose, Bld: 229 mg/dL — ABNORMAL HIGH (ref 70–99)
Potassium: 4.3 mmol/L (ref 3.5–5.1)
Sodium: 137 mmol/L (ref 135–145)

## 2024-01-15 LAB — CBC
HCT: 42.4 % (ref 39.0–52.0)
Hemoglobin: 14.2 g/dL (ref 13.0–17.0)
MCH: 30.1 pg (ref 26.0–34.0)
MCHC: 33.5 g/dL (ref 30.0–36.0)
MCV: 90 fL (ref 80.0–100.0)
Platelets: 303 K/uL (ref 150–400)
RBC: 4.71 MIL/uL (ref 4.22–5.81)
RDW: 11.9 % (ref 11.5–15.5)
WBC: 5.7 K/uL (ref 4.0–10.5)
nRBC: 0 % (ref 0.0–0.2)

## 2024-01-15 LAB — TROPONIN I (HIGH SENSITIVITY)
Troponin I (High Sensitivity): 7 ng/L (ref <18)
Troponin I (High Sensitivity): 7 ng/L (ref <18)

## 2024-01-15 NOTE — ED Provider Notes (Signed)
 [place] EMERGENCY DEPARTMENT AT [facility] Provider Note   CSN: [id number] Arrival date & time: 01/15/24  1042     Patient presents with: Chest Pain and Shortness of Breath   Daniel Savage is a 73 y.o. male who presents for central chest pressure as well as shortness of breath.  Shortness of breath has been present for several weeks and has been getting progressively worse, over the last 2 days he has also had chest discomfort.  Went to his primary care who did a transthoracic echocardiogram, showed concern for left ventricular function referred to the ED as with this finding along with shortness of breath they were concerned for possible heart failure.    Chest Pain Associated symptoms: shortness of breath   Shortness of Breath Associated symptoms: chest pain        Prior to Admission medications   Medication Sig Start Date End Date Taking? Authorizing Provider  GARLIC PO Take 1 tablet by mouth daily.    [provider]  glipiZIDE  (GLUCOTROL ) 5 MG tablet Take 1 tablet (5 mg total) by mouth daily before breakfast. 08/22/17 08/22/18  Guido Hire, MD  pantoprazole  (PROTONIX ) 40 MG tablet Take 1 tablet (40 mg total) by mouth daily. 08/23/17   Guido Hire, MD    Allergies: Patient has no known allergies.    Review of Systems  Respiratory:  Positive for shortness of breath.   Cardiovascular:  Positive for chest pain.  All other systems reviewed and are negative.   Updated Vital Signs BP 136/83 (BP Location: Right Arm)   Pulse 79   Temp 97.9 F (36.6 C) (Oral)   Resp 17   Ht 5' 6 (1.676 m)   Wt 63.5 kg   SpO2 100%   BMI 22.60 kg/m   Physical Exam Vitals and nursing note reviewed.  Constitutional:      General: He is not in acute distress.    Appearance: Normal appearance.  HENT:     Head: Normocephalic and atraumatic.     Mouth/Throat:     Mouth: Mucous membranes are moist.     Pharynx: Oropharynx is clear.  Eyes:     Extraocular  Movements: Extraocular movements intact.     Conjunctiva/sclera: Conjunctivae normal.     Pupils: Pupils are equal, round, and reactive to light.  Cardiovascular:     Rate and Rhythm: Normal rate and regular rhythm.     Pulses: Normal pulses.     Heart sounds: Normal heart sounds. No murmur heard.    No friction rub. No gallop.  Pulmonary:     Effort: Pulmonary effort is normal.     Breath sounds: Normal air entry. Examination of the right-middle field reveals wheezing. Examination of the left-middle field reveals wheezing. Examination of the right-lower field reveals wheezing. Examination of the left-lower field reveals wheezing. Wheezing present.     Comments: Mild inspiratory wheeze Abdominal:     General: Abdomen is flat. Bowel sounds are normal.     Palpations: Abdomen is soft.  Musculoskeletal:        General: Normal range of motion.     Cervical back: Normal range of motion and neck supple.     Right lower leg: No edema.     Left lower leg: No edema.  Skin:    General: Skin is warm and dry.     Capillary Refill: Capillary refill takes less than 2 seconds.  Neurological:     General: No focal deficit present.  Mental Status: He is alert. Mental status is at baseline.  Psychiatric:        Mood and Affect: Mood normal.     (all labs ordered are listed, but only abnormal results are displayed) Labs Reviewed  BASIC METABOLIC PANEL WITH GFR - Abnormal; Notable for the following components:      Result Value   Glucose, Bld 229 (*)    Creatinine, Ser 1.29 (*)    Calcium 10.4 (*)    GFR, Estimated 60 (*)    All other components within normal limits  CBC  TROPONIN I (HIGH SENSITIVITY)  TROPONIN I (HIGH SENSITIVITY)    EKG: None  Radiology: DG Chest 2 View Result Date: 01/15/2024 EXAM: 2 VIEW(S) XRAY OF THE CHEST 01/15/2024 11:35:00 AM COMPARISON: 08/21/2017. CT chest 12/18/2023. CLINICAL HISTORY: SOB FINDINGS: LUNGS AND PLEURA: Hyperinflation. No focal pulmonary  opacity. No pulmonary edema. No pleural effusion. No pneumothorax. HEART AND MEDIASTINUM: No acute abnormality of the cardiac and mediastinal silhouettes. BONES AND SOFT TISSUES: No acute osseous abnormality. IMPRESSION: 1. No acute cardiopulmonary findings. 2. Hyperinflation. Electronically signed by: Harrietta Sherry MD 01/15/2024 12:09 PM EST RP Workstation: [id number]     Procedures   Medications Ordered in the ED - No data to display                                  Medical Decision Making Amount and/or Complexity of Data Reviewed Labs: ordered. Radiology: ordered.   Medical Decision Making:   Daniel Savage is a 73 y.o. male who presented to the ED today with chest pain and shortness of breath detailed above.    Additional history discussed with patient's family/caregivers.  External chart has been reviewed including previous imaging and referral notes. Complete initial physical exam performed, notably the patient  was alert and oriented in no apparent distress.  Physical exam is largely benign with no concerning findings.  He does have a mild inspiratory wheeze.    Reviewed and confirmed nursing documentation for past medical history, family history, social history.    Initial Assessment:   With the patient's presentation of shortness of breath and chest discomfort, given patient's smoking history consider possible COPD, cor pulmonale.  Further, consider possible acute or subacute coronary syndrome, STEMI/NSTEMI.  Also consider infectious etiology.  Initial Plan:   Screening labs including CBC and Metabolic panel to evaluate for infectious or metabolic etiology of disease.  CXR to evaluate for structural/infectious intrathoracic pathology.  EKG and serial troponin to evaluate for cardiac pathology. Objective evaluation as below reviewed   Initial Study Results:   Laboratory  All laboratory results reviewed without evidence of clinically relevant pathology.   Exceptions  include: Creatinine is elevated at 1.29 however in review of his labs this seems to be baseline for the patient.  Glucose is elevated to 229.  EKG EKG was reviewed independently. Rate, rhythm, axis, intervals all examined and without medically relevant abnormality. ST segments without concerns for elevations.    Radiology:  All images reviewed independently. Agree with radiology report at this time.   DG Chest 2 View Result Date: 01/15/2024 EXAM: 2 VIEW(S) XRAY OF THE CHEST 01/15/2024 11:35:00 AM COMPARISON: 08/21/2017. CT chest 12/18/2023. CLINICAL HISTORY: SOB FINDINGS: LUNGS AND PLEURA: Hyperinflation. No focal pulmonary opacity. No pulmonary edema. No pleural effusion. No pneumothorax. HEART AND MEDIASTINUM: No acute abnormality of the cardiac and mediastinal silhouettes. BONES AND SOFT TISSUES: No acute  osseous abnormality. IMPRESSION: 1. No acute cardiopulmonary findings. 2. Hyperinflation. Electronically signed by: Harrietta Sherry MD 01/15/2024 12:09 PM EST RP Workstation: [id number]   CT CHEST LUNG CANCER SCREENING LOW DOSE WO CONTRAST Result Date: 12/23/2023 CLINICAL DATA:  Current smoker with 50 pack-year smoking history. Baseline lung cancer screening. EXAM: CT CHEST WITHOUT CONTRAST LOW-DOSE FOR LUNG CANCER SCREENING TECHNIQUE: Multidetector CT imaging of the chest was performed following the standard protocol without IV contrast. RADIATION DOSE REDUCTION: This exam was performed according to the departmental dose-optimization program which includes automated exposure control, adjustment of the mA and/or kV according to patient size and/or use of iterative reconstruction technique. COMPARISON:  CTA chest dated 08/21/2017 FINDINGS: Cardiovascular: Normal heart size. No significant pericardial fluid/thickening. Great vessels are normal in course and caliber. Coronary artery calcifications and aortic atherosclerosis. Mediastinum/Nodes: Imaged thyroid gland without nodules meeting criteria for  imaging follow-up by size. Normal esophagus. No pathologically enlarged axillary, supraclavicular, mediastinal, or hilar lymph nodes. Lungs/Pleura: The central airways are patent. Mild centrilobular emphysema and diffuse bronchial wall thickening. No focal consolidation. Scattered nodules measuring up to 5.8 mm subpleural left upper lobe (3: 52). No pneumothorax. No pleural effusion. Upper abdomen: Normal. Musculoskeletal: No acute or abnormal lytic or blastic osseous lesions. Multilevel degenerative changes of the thoracic spine. IMPRESSION: 1. Lung-RADS 2, benign appearance or behavior. Continue annual screening with low-dose chest CT without contrast in 12 months. 2. Aortic Atherosclerosis (ICD10-I70.0) and Emphysema (ICD10-J43.9). Coronary artery calcifications. Assessment for potential risk factor modification, dietary therapy or pharmacologic therapy may be warranted, if clinically indicated. Electronically Signed   By: Limin  Xu M.D.   On: 12/23/2023 12:30      Consults: Case discussed with Dr. Cyrena with cardiology..   Reassessment and Plan:   Consulted with cardiology, they are not aware of this patient is a 42 echocardiogram was performed in the outpatient setting.  Given this, along with reassuring findings that show likely COPD, given hyperinflation of the lungs along with physical exam findings of expiratory wheezing.  Likely causative of his shortness of breath, however will refer to cardiology for further assessment and workup given the abnormal echocardiogram obtained.  He understands agrees with care plan and has no further concerns at this time.  Since he has a reassuring physical exam, and reassuring workup we will discharge with outpatient management as previously discussed.       Final diagnoses:  Atypical chest pain    ED Discharge Orders          Ordered    Ambulatory referral to Cardiology       Comments: If you have not heard from the Cardiology office within the next 72  hours please call [phone].   01/15/24 2048               Myriam Dorn BROCKS, PA 01/16/24 0015    Geraldene Hamilton, MD 01/19/24 [phone]

## 2024-01-15 NOTE — ED Triage Notes (Signed)
 Pt present for 5/10 CP x 2 days and SOB.  Sent here by doctor for abnormal US  of heart.
